# Patient Record
Sex: Male | Born: 1990 | Race: Black or African American | Hispanic: No | Marital: Single | Smoking: Former smoker
Health system: Southern US, Community
[De-identification: ages and names within clinical notes are randomized; demographics above are authoritative.]

## PROBLEM LIST (undated history)

## (undated) DIAGNOSIS — F419 Anxiety disorder, unspecified: Secondary | ICD-10-CM

---
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
## 2010-07-19 ENCOUNTER — Emergency Department (HOSPITAL_COMMUNITY)
Admission: EM | Admit: 2010-07-19 | Discharge: 2010-07-19 | Payer: Self-pay | Source: Home / Self Care | Admitting: Family Medicine

## 2010-07-20 LAB — POCT INFECTIOUS MONO SCREEN: Mono Screen: POSITIVE — AB

## 2010-07-20 LAB — POCT RAPID STREP A (OFFICE): Streptococcus, Group A Screen (Direct): NEGATIVE

## 2012-04-13 ENCOUNTER — Encounter (HOSPITAL_COMMUNITY): Payer: Self-pay

## 2012-04-13 ENCOUNTER — Encounter (HOSPITAL_COMMUNITY)
Admission: EM | Disposition: A | Discharge status: Home Health | Payer: Self-pay | Source: Home / Self Care | Attending: Otolaryngology

## 2012-04-13 ENCOUNTER — Inpatient Hospital Stay (HOSPITAL_COMMUNITY)
Admission: EM | Admit: 2012-04-13 | Discharge: 2012-04-19 | DRG: 013 | Disposition: A | Discharge status: Home Health | Payer: 59 | Attending: Otolaryngology | Admitting: Otolaryngology

## 2012-04-13 ENCOUNTER — Encounter (HOSPITAL_COMMUNITY): Payer: Self-pay | Admitting: Certified Registered Nurse Anesthetist

## 2012-04-13 ENCOUNTER — Emergency Department (HOSPITAL_COMMUNITY): Payer: 59

## 2012-04-13 ENCOUNTER — Observation Stay (HOSPITAL_COMMUNITY): Payer: 59 | Admitting: Certified Registered Nurse Anesthetist

## 2012-04-13 DIAGNOSIS — Y998 Other external cause status: Secondary | ICD-10-CM

## 2012-04-13 DIAGNOSIS — R0989 Other specified symptoms and signs involving the circulatory and respiratory systems: Secondary | ICD-10-CM | POA: Diagnosis not present

## 2012-04-13 DIAGNOSIS — Y9210 Unspecified residential institution as the place of occurrence of the external cause: Secondary | ICD-10-CM | POA: Diagnosis present

## 2012-04-13 DIAGNOSIS — S02620A Fracture of subcondylar process of mandible, unspecified side, initial encounter for closed fracture: Principal | ICD-10-CM | POA: Diagnosis present

## 2012-04-13 DIAGNOSIS — W19XXXA Unspecified fall, initial encounter: Secondary | ICD-10-CM | POA: Diagnosis present

## 2012-04-13 DIAGNOSIS — R55 Syncope and collapse: Secondary | ICD-10-CM

## 2012-04-13 DIAGNOSIS — R221 Localized swelling, mass and lump, neck: Secondary | ICD-10-CM | POA: Diagnosis not present

## 2012-04-13 DIAGNOSIS — F17200 Nicotine dependence, unspecified, uncomplicated: Secondary | ICD-10-CM | POA: Diagnosis present

## 2012-04-13 DIAGNOSIS — S02610A Fracture of condylar process of mandible, unspecified side, initial encounter for closed fracture: Secondary | ICD-10-CM

## 2012-04-13 DIAGNOSIS — R0609 Other forms of dyspnea: Secondary | ICD-10-CM | POA: Diagnosis not present

## 2012-04-13 DIAGNOSIS — S0266XA Fracture of symphysis of mandible, initial encounter for closed fracture: Secondary | ICD-10-CM | POA: Diagnosis present

## 2012-04-13 DIAGNOSIS — R220 Localized swelling, mass and lump, head: Secondary | ICD-10-CM | POA: Diagnosis not present

## 2012-04-13 DIAGNOSIS — S0180XA Unspecified open wound of other part of head, initial encounter: Secondary | ICD-10-CM | POA: Diagnosis present

## 2012-04-13 HISTORY — PX: LACERATION REPAIR: SHX5284

## 2012-04-13 HISTORY — PX: ORIF MANDIBULAR FRACTURE: SHX2127

## 2012-04-13 SURGERY — OPEN REDUCTION INTERNAL FIXATION (ORIF) MANDIBULAR FRACTURE
Anesthesia: General | Site: Mouth | Wound class: Clean Contaminated

## 2012-04-13 MED ORDER — DIPHENHYDRAMINE HCL 50 MG/ML IJ SOLN
12.5000 mg | Freq: Four times a day (QID) | INTRAMUSCULAR | Status: DC | PRN
  Administered 2012-04-14 – 2012-04-18 (×3): 12.5 mg via INTRAVENOUS
  Filled 2012-04-13 (×2): qty 1
  Filled 2012-04-13: qty 0.25
  Filled 2012-04-13: qty 1

## 2012-04-13 MED ORDER — ARTIFICIAL TEARS OP OINT
TOPICAL_OINTMENT | OPHTHALMIC | Status: DC | PRN
  Administered 2012-04-13: 1 via OPHTHALMIC

## 2012-04-13 MED ORDER — DEXTROSE-NACL 5-0.45 % IV SOLN
INTRAVENOUS | Status: DC
  Administered 2012-04-14: 02:00:00 via INTRAVENOUS
  Administered 2012-04-14: 75 mL/h via INTRAVENOUS
  Administered 2012-04-17: 06:00:00 via INTRAVENOUS
  Administered 2012-04-18: 75 mL/h via INTRAVENOUS
  Administered 2012-04-18: 1000 mL via INTRAVENOUS
  Administered 2012-04-19: 08:00:00 via INTRAVENOUS

## 2012-04-13 MED ORDER — MORPHINE SULFATE 4 MG/ML IJ SOLN
4.0000 mg | Freq: Once | INTRAMUSCULAR | Status: AC
  Administered 2012-04-13: 4 mg via INTRAVENOUS
  Filled 2012-04-13: qty 1

## 2012-04-13 MED ORDER — DEXAMETHASONE SODIUM PHOSPHATE 10 MG/ML IJ SOLN
10.0000 mg | Freq: Three times a day (TID) | INTRAMUSCULAR | Status: AC
  Administered 2012-04-14 – 2012-04-15 (×4): 10 mg via INTRAVENOUS
  Filled 2012-04-13 (×10): qty 1

## 2012-04-13 MED ORDER — ONDANSETRON HCL 4 MG/2ML IJ SOLN
4.0000 mg | Freq: Four times a day (QID) | INTRAMUSCULAR | Status: DC | PRN
  Administered 2012-04-17: 4 mg via INTRAVENOUS
  Filled 2012-04-13: qty 2

## 2012-04-13 MED ORDER — MORPHINE SULFATE 2 MG/ML IJ SOLN
2.0000 mg | INTRAMUSCULAR | Status: DC | PRN
  Administered 2012-04-14 (×3): 2 mg via INTRAVENOUS
  Filled 2012-04-13 (×5): qty 1

## 2012-04-13 MED ORDER — ACETAMINOPHEN 160 MG/5ML PO SOLN
325.0000 mg | ORAL | Status: DC | PRN
  Filled 2012-04-13: qty 10.2

## 2012-04-13 MED ORDER — OXYMETAZOLINE HCL 0.05 % NA SOLN
NASAL | Status: DC | PRN
  Administered 2012-04-13: 1 via NASAL

## 2012-04-13 MED ORDER — SUCCINYLCHOLINE CHLORIDE 20 MG/ML IJ SOLN
INTRAMUSCULAR | Status: DC | PRN
  Administered 2012-04-13: 100 mg via INTRAVENOUS
  Administered 2012-04-13: 20 mg via INTRAVENOUS

## 2012-04-13 MED ORDER — MIDAZOLAM HCL 5 MG/5ML IJ SOLN
INTRAMUSCULAR | Status: DC | PRN
  Administered 2012-04-13: 2 mg via INTRAVENOUS

## 2012-04-13 MED ORDER — LIDOCAINE HCL (CARDIAC) 20 MG/ML IV SOLN
INTRAVENOUS | Status: DC | PRN
  Administered 2012-04-13: 80 mg via INTRAVENOUS

## 2012-04-13 MED ORDER — PROPOFOL 10 MG/ML IV BOLUS
INTRAVENOUS | Status: DC | PRN
  Administered 2012-04-13: 60 mg via INTRAVENOUS
  Administered 2012-04-13: 200 mg via INTRAVENOUS

## 2012-04-13 MED ORDER — HYDROCODONE-ACETAMINOPHEN 7.5-500 MG/15ML PO SOLN
15.0000 mL | ORAL | Status: DC | PRN
  Administered 2012-04-14 – 2012-04-19 (×10): 15 mL via ORAL
  Filled 2012-04-13 (×10): qty 15

## 2012-04-13 MED ORDER — PROMETHAZINE HCL 25 MG/ML IJ SOLN
25.0000 mg | INTRAMUSCULAR | Status: DC | PRN
  Administered 2012-04-14 (×2): 12.5 mg via INTRAVENOUS
  Filled 2012-04-13: qty 1

## 2012-04-13 MED ORDER — LIDOCAINE-EPINEPHRINE 1 %-1:100000 IJ SOLN
INTRAMUSCULAR | Status: DC | PRN
  Administered 2012-04-13: 10 mL

## 2012-04-13 MED ORDER — FENTANYL CITRATE 0.05 MG/ML IJ SOLN
INTRAMUSCULAR | Status: DC | PRN
  Administered 2012-04-13 – 2012-04-14 (×5): 50 ug via INTRAVENOUS

## 2012-04-13 MED ORDER — ONDANSETRON HCL 4 MG PO TABS
4.0000 mg | ORAL_TABLET | Freq: Four times a day (QID) | ORAL | Status: DC | PRN
  Filled 2012-04-13: qty 0.5

## 2012-04-13 MED ORDER — LACTATED RINGERS IV SOLN
INTRAVENOUS | Status: DC | PRN
  Administered 2012-04-13 (×2): via INTRAVENOUS

## 2012-04-13 MED ORDER — ONDANSETRON HCL 4 MG/2ML IJ SOLN
INTRAMUSCULAR | Status: DC | PRN
  Administered 2012-04-13: 4 mg via INTRAVENOUS

## 2012-04-13 MED ORDER — CEFAZOLIN SODIUM 1-5 GM-% IV SOLN
1.0000 g | Freq: Three times a day (TID) | INTRAVENOUS | Status: DC
  Administered 2012-04-14 – 2012-04-17 (×10): 1 g via INTRAVENOUS
  Administered 2012-04-17: 2 g via INTRAVENOUS
  Administered 2012-04-17 – 2012-04-19 (×7): 1 g via INTRAVENOUS
  Filled 2012-04-13 (×25): qty 50

## 2012-04-13 MED ORDER — TETANUS-DIPHTH-ACELL PERTUSSIS 5-2.5-18.5 LF-MCG/0.5 IM SUSP
0.5000 mL | Freq: Once | INTRAMUSCULAR | Status: AC
  Administered 2012-04-13: 0.5 mL via INTRAMUSCULAR
  Filled 2012-04-13 (×2): qty 0.5

## 2012-04-13 MED ORDER — DEXAMETHASONE SODIUM PHOSPHATE 4 MG/ML IJ SOLN
INTRAMUSCULAR | Status: DC | PRN
  Administered 2012-04-13: 10 mg via INTRAVENOUS

## 2012-04-13 SURGICAL SUPPLY — 44 items
BIT DRILL TWIST 1.3X35MM (BIT) IMPLANT
BLADE SURG 15 STRL LF DISP TIS (BLADE) ×2 IMPLANT
BLADE SURG 15 STRL SS (BLADE) ×3
BLADE SURG ROTATE 9660 (MISCELLANEOUS) IMPLANT
CANISTER SUCTION 2500CC (MISCELLANEOUS) ×3 IMPLANT
CLEANER TIP ELECTROSURG 2X2 (MISCELLANEOUS) ×3 IMPLANT
CLOTH BEACON ORANGE TIMEOUT ST (SAFETY) ×3 IMPLANT
COVER SURGICAL LIGHT HANDLE (MISCELLANEOUS) ×3 IMPLANT
DECANTER SPIKE VIAL GLASS SM (MISCELLANEOUS) ×2 IMPLANT
DRILL TWIST 1.3X35MM (BIT) ×3
DRSG NASOPORE 8CM (GAUZE/BANDAGES/DRESSINGS) ×2 IMPLANT
ELECT COATED BLADE 2.86 ST (ELECTRODE) ×3 IMPLANT
ELECT REM PT RETURN 9FT ADLT (ELECTROSURGICAL) ×3
ELECTRODE REM PT RTRN 9FT ADLT (ELECTROSURGICAL) ×2 IMPLANT
GLOVE SURG SS PI 7.5 STRL IVOR (GLOVE) ×3 IMPLANT
GOWN STRL NON-REIN LRG LVL3 (GOWN DISPOSABLE) ×6 IMPLANT
KIT BASIN OR (CUSTOM PROCEDURE TRAY) ×3 IMPLANT
KIT ROOM TURNOVER OR (KITS) ×3 IMPLANT
NEEDLE 27GAX1X1/2 (NEEDLE) ×3 IMPLANT
NS IRRIG 1000ML POUR BTL (IV SOLUTION) ×3 IMPLANT
PAD ARMBOARD 7.5X6 YLW CONV (MISCELLANEOUS) ×6 IMPLANT
PENCIL BUTTON HOLSTER BLD 10FT (ELECTRODE) ×3 IMPLANT
PLATE 4H FRACTURE (Plate) ×2 IMPLANT
SCISSORS WIRE ANG 4 3/4 DISP (INSTRUMENTS) ×3 IMPLANT
SCREW BONE CROSS PIN 2.0X10MM (Screw) ×2 IMPLANT
SCREW BONE CROSS PIN 2.0X12MM (Screw) ×2 IMPLANT
SCREW UPPER FACE 2.0X12MM (Screw) ×4 IMPLANT
SCREW UPPER FACE 2.0X8MM (Screw) ×3 IMPLANT
SUT CHROMIC 4 0 PS 2 18 (SUTURE) ×2 IMPLANT
SUT ETHILON 4 0 CL P 3 (SUTURE) IMPLANT
SUT MON AB 3-0 SH 27 (SUTURE) ×6
SUT MON AB 3-0 SH27 (SUTURE) ×4 IMPLANT
SUT PROLENE 6 0 PC 1 (SUTURE) IMPLANT
SUT STEEL 0 (SUTURE)
SUT STEEL 0 18XMFL TIE 17 (SUTURE) IMPLANT
SUT STEEL 1 (SUTURE) IMPLANT
SUT STEEL 2 (SUTURE) IMPLANT
SUT STEEL 4 (SUTURE) IMPLANT
SUT VICRYL 4-0 PS2 18IN ABS (SUTURE) ×1 IMPLANT
TOWEL OR 17X24 6PK STRL BLUE (TOWEL DISPOSABLE) ×3 IMPLANT
TOWEL OR 17X26 10 PK STRL BLUE (TOWEL DISPOSABLE) ×3 IMPLANT
TRAY ENT MC OR (CUSTOM PROCEDURE TRAY) ×3 IMPLANT
TRAY FOLEY CATH 14FRSI W/METER (CATHETERS) IMPLANT
WATER STERILE IRR 1000ML POUR (IV SOLUTION) ×3 IMPLANT

## 2012-04-13 NOTE — ED Notes (Signed)
Pt had syncopal episode after giving blood, fell from standing position and hit face on curb, fell total of two times, abrasion on upper lip jaw bilateral hands.

## 2012-04-13 NOTE — Anesthesia Procedure Notes (Signed)
Procedure Name: Intubation Date/Time: 04/13/2012 11:00 PM Performed by: Julianne Rice Z Pre-anesthesia Checklist: Patient identified, Timeout performed, Emergency Drugs available, Suction available and Patient being monitored Patient Re-evaluated:Patient Re-evaluated prior to inductionOxygen Delivery Method: Circle system utilized Preoxygenation: Pre-oxygenation with 100% oxygen Intubation Type: IV induction Ventilation: Mask ventilation without difficulty Laryngoscope Size: Mac and 4 Grade View: Grade I Tube type: Oral Rae Nasal Tubes: Nasal prep performed and Magill forceps- large, utilized Tube size: 7.5 mm Number of attempts: 2 Airway Equipment and Method: Video-laryngoscopy Placement Confirmation: ETT inserted through vocal cords under direct vision,  positive ETCO2 and breath sounds checked- equal and bilateral Tube secured with: Tape Dental Injury: Teeth and Oropharynx as per pre-operative assessment and Bloody posterior oropharynx  Comments: Grade 1 view with laryngoscopy with MAC 4 but unable to pass through vocal cords; post. oropharynx bloody; changed to Glide Scope; Grade I view; easily intubated.

## 2012-04-13 NOTE — H&P (Addendum)
04/13/12 9:46 PM  Gabriel Nguyen  PREOPERATIVE HISTORY AND PHYSICAL  CHIEF COMPLAINT: comminuted mandible fracture  HISTORY: This is a 21 year old who presents with bilateral subcondylar and right parasymphyseal mandible fractures after falling after giving blood and striking his jaw/face on the ground.  He now presents for maxillomandibular fixation and open reduction and internal fixation of comminuted mandible fracture.  Dr. Emeline Darling, [name] has discussed the risks (including malocclusion, nonunion, malunion, bleeding, scarring, risks of general anesthesia), benefits, and alternatives of this procedure. The patient understands the risks and would like to proceed with the procedure. The chances of success of the procedure are >50%  and the patient understands this. I personally performed an examination of the patient within 24 hours of the procedure.  PAST MEDICAL HISTORY: History reviewed. No pertinent past medical history.   PAST SURGICAL HISTORY: History reviewed. No pertinent past surgical history.  MEDICATIONS: No current facility-administered medications on file prior to encounter.   No current outpatient prescriptions on file prior to encounter.    ALLERGIES: No Known Allergies  SOCIAL HISTORY: History   Social History  . Marital Status: Single    Spouse Name: N/A    Number of Children: N/A  . Years of Education: N/A   Occupational History  . Not on file.   Social History Main Topics  . Smoking status: Current Every Day Smoker -- 0.5 packs/day    Types: Cigarettes  . Smokeless tobacco: Not on file  . Alcohol Use: Yes     once a month  . Drug Use:   . Sexually Active:    Other Topics Concern  . Not on file   Social History Narrative  . No narrative on file    FAMILY HISTORY:History reviewed. No pertinent family history.  REVIEW OF SYSTEMS:  HEENT: facial pain and swelling/malocclusion   PHYSICAL EXAM:  GENERAL:  Awake, alert VITAL SIGNS:   Filed  Vitals:   04/13/12 1913  BP: 124/80  Pulse: 67  Temp: 97.7 F (36.5 C)  Resp: 18   SKIN:  Warm, dry HEENT:  Obvious class II malocclusion (underbite) with open bite deformity with 5 cm chin laceration and right subcondylar fracture and bilateral subcondylar deformity and stepoffs. Tongue is mobile. NECK:  Trachea midline, no edema LYMPH:  No LAD LUNGS:  Grossly clear CARDIOVASCULAR:  RRR ABDOMEN:  Soft, NT MUSCULOSKELETAL: normal LE, UE other than abrasions on hands PSYCH:  Normal affect NEUROLOGIC:  CN2-12 grossly intact and symmetric  DIAGNOSTIC STUDIES: CT maxillofacial shows bilateral displaced subcondylar fractures with right parasymphyseal fracture medial to right mental nerve.  ASSESSMENT AND PLAN: Plan to proceed with open reduction and internal fixation of right subcondylar fracture, closed reduction of bilateral subcondylar fractures with 8 -post MMF for 4-6 weeks. Patient understands the risks, benefits, and alternatives. Informed written consent on chart. 04/13/12 9:46 PM Gabriel Nguyen

## 2012-04-13 NOTE — ED Provider Notes (Signed)
History     CSN: [id number]  Arrival date & time 04/13/12  1905   First MD Initiated Contact with Patient 04/13/12 1931      Chief Complaint  Patient presents with  . Loss of Consciousness    Patient is a 21 y.o. male presenting with syncope. The history is provided by the patient.  Loss of Consciousness This is a new problem. Episode onset: just prior to arrival. The problem has been gradually improving. Associated symptoms include headaches. Pertinent negatives include no chest pain and no shortness of breath. Nothing aggravates the symptoms. Nothing relieves the symptoms.  Pt presents after syncopal episode He gave blood at blood drive today He drove himself to meet family, and about an hour after giving blood he had syncopal event.  Family attempted to help him and he had another brief syncopal event.  No seizure reported He now reports headache.  He reports he injured his face in the fall.  No neck or back pain No cp/sob.  No focal weakness  PMH - none  History reviewed. No pertinent past surgical history.  History reviewed. No pertinent family history.  History  Substance Use Topics  . Smoking status: Current Every Day Smoker -- 0.5 packs/day    Types: Cigarettes  . Smokeless tobacco: Not on file  . Alcohol Use: Yes     once a month      Review of Systems  Respiratory: Negative for shortness of breath.   Cardiovascular: Positive for syncope. Negative for chest pain.  Neurological: Positive for headaches.  All other systems reviewed and are negative.    Allergies  Review of patient's allergies indicates no known allergies.  Home Medications  No current outpatient prescriptions on file.  BP 124/80  Pulse 67  Temp 97.7 F (36.5 C) (Oral)  Resp 18  Ht 6\' 5"  (1.956 m)  Wt 170 lb (77.111 kg)  BMI 20.16 kg/m2  Physical Exam CONSTITUTIONAL: Well developed/well nourished HEAD AND FACE: Normocephalic/atraumatic EYES: EOMI/PERRL ENMT: no nasal  deformity/septal hematoma.  No dental fracture and midface stable.  He has malocclusion and tenderness to right lower mandible.  Laceration noted to lower chin.   NECK: no anterior neck tenderness.  No bruising noted.   SPINE:entire spine nontender, c-collar in place CV: S1/S2 noted, no murmurs/rubs/gallops noted LUNGS: Lungs are clear to auscultation bilaterally, no apparent distress ABDOMEN: soft, nontender, no rebound or guarding GU:no cva tenderness NEURO: Pt is awake/alert, moves all extremitiesx4, GCS 15 EXTREMITIES: pulses normal, full ROM.  Scattered abrasions to hands but no lacerations and no extremity injury noted SKIN: warm, color normal PSYCH: no abnormalities of mood noted  ED Course  Procedures 8:27 PM Pt here for syncopal event after donating blood.  He has no h/o syncope previously, no h/o seizure.  He is back to baseline.  He does have facial injury noted.  Unable to clear cspine due to severe pain in face. Ct imaging ordered Suspect EKG shows early repolarization No cp/sob reported    9:47 PM D/w dr Emeline Darling with ENT Will see in ED ccollar removed Pt denies cp/sob No extremity injury noted   MDM  Nursing notes including past medical history and social history reviewed and considered in documentation        Date: 04/13/2012  Rate: 71  Rhythm: normal sinus rhythm  QRS Axis: normal  Intervals: normal  ST/T Wave abnormalities: early repolarization  Conduction Disutrbances:none  Narrative Interpretation:   Old EKG Reviewed: none available  Joya Gaskins, MD 04/13/12 [phone]

## 2012-04-13 NOTE — Anesthesia Preprocedure Evaluation (Addendum)
Anesthesia Evaluation  Patient identified by MRN, date of birth, ID band Patient awake    Reviewed: Allergy & Precautions, H&P , NPO status , Patient's Chart, lab work & pertinent test results  Airway Mallampati: II      Dental   Pulmonary neg pulmonary ROS, Current Smoker,  breath sounds clear to auscultation        Cardiovascular Rhythm:Regular Rate:Normal     Neuro/Psych    GI/Hepatic negative GI ROS, Neg liver ROS,   Endo/Other  negative endocrine ROS  Renal/GU      Musculoskeletal   Abdominal   Peds  Hematology negative hematology ROS (+)   Anesthesia Other Findings   Reproductive/Obstetrics                         Anesthesia Physical Anesthesia Plan  ASA: II  Anesthesia Plan: General   Post-op Pain Management:    Induction: Intravenous, Rapid sequence and Cricoid pressure planned  Airway Management Planned: Nasal ETT  Additional Equipment:   Intra-op Plan:   Post-operative Plan: Possible Post-op intubation/ventilation  Informed Consent:   Dental advisory given  Plan Discussed with: CRNA, Anesthesiologist and Surgeon  Anesthesia Plan Comments:        Anesthesia Quick Evaluation

## 2012-04-13 NOTE — Preoperative (Signed)
Beta Blockers   Reason not to administer Beta Blockers:Not Applicable 

## 2012-04-14 ENCOUNTER — Encounter (HOSPITAL_COMMUNITY): Payer: Self-pay | Admitting: Anesthesiology

## 2012-04-14 ENCOUNTER — Observation Stay (HOSPITAL_COMMUNITY): Payer: 59 | Admitting: Anesthesiology

## 2012-04-14 ENCOUNTER — Encounter (HOSPITAL_COMMUNITY)
Admission: EM | Disposition: A | Discharge status: Home Health | Payer: Self-pay | Source: Home / Self Care | Attending: Otolaryngology

## 2012-04-14 ENCOUNTER — Observation Stay (HOSPITAL_COMMUNITY): Payer: 59

## 2012-04-14 HISTORY — PX: TRACHEOSTOMY TUBE PLACEMENT: SHX814

## 2012-04-14 LAB — POCT I-STAT 4, (NA,K, GLUC, HGB,HCT)
Glucose, Bld: 266 mg/dL — ABNORMAL HIGH (ref 70–99)
HCT: 43 % (ref 39.0–52.0)
Hemoglobin: 14.6 g/dL (ref 13.0–17.0)
Potassium: 4.1 meq/L (ref 3.5–5.1)
Sodium: 137 meq/L (ref 135–145)

## 2012-04-14 LAB — MRSA PCR SCREENING: MRSA by PCR: NEGATIVE

## 2012-04-14 SURGERY — CREATION, TRACHEOSTOMY
Anesthesia: General | Site: Neck | Wound class: Clean

## 2012-04-14 MED ORDER — HYDROMORPHONE HCL PF 1 MG/ML IJ SOLN
0.2500 mg | INTRAMUSCULAR | Status: DC | PRN
  Administered 2012-04-14: 0.5 mg via INTRAVENOUS

## 2012-04-14 MED ORDER — BACITRACIN ZINC 500 UNIT/GM EX OINT
TOPICAL_OINTMENT | Freq: Three times a day (TID) | CUTANEOUS | Status: DC
  Administered 2012-04-14: 1 via TOPICAL
  Administered 2012-04-15: 16:00:00 via TOPICAL
  Administered 2012-04-15: 1 via TOPICAL
  Administered 2012-04-16 – 2012-04-19 (×9): via TOPICAL
  Filled 2012-04-14 (×3): qty 15

## 2012-04-14 MED ORDER — ALPRAZOLAM 0.5 MG PO TABS
1.0000 mg | ORAL_TABLET | Freq: Two times a day (BID) | ORAL | Status: DC | PRN
  Administered 2012-04-14 – 2012-04-15 (×2): 1 mg via ORAL
  Filled 2012-04-14 (×2): qty 2

## 2012-04-14 MED ORDER — DEXTROSE-NACL 5-0.45 % IV SOLN
INTRAVENOUS | Status: DC | PRN
  Administered 2012-04-14: 03:00:00 via INTRAVENOUS

## 2012-04-14 MED ORDER — HYDROMORPHONE HCL PF 1 MG/ML IJ SOLN: 0.2500 mg | INTRAMUSCULAR | Status: DC | PRN

## 2012-04-14 MED ORDER — LIDOCAINE-EPINEPHRINE 1 %-1:100000 IJ SOLN
INTRAMUSCULAR | Status: DC | PRN
  Administered 2012-04-14: 7 mL

## 2012-04-14 MED ORDER — MORPHINE SULFATE 2 MG/ML IJ SOLN
2.0000 mg | INTRAMUSCULAR | Status: DC | PRN
  Administered 2012-04-14 – 2012-04-18 (×16): 2 mg via INTRAVENOUS
  Filled 2012-04-14 (×5): qty 1
  Filled 2012-04-14: qty 2
  Filled 2012-04-14 (×8): qty 1

## 2012-04-14 MED ORDER — PROPOFOL 10 MG/ML IV BOLUS
INTRAVENOUS | Status: DC | PRN
  Administered 2012-04-14: 50 mg via INTRAVENOUS

## 2012-04-14 SURGICAL SUPPLY — 47 items
BLADE SURG 11 STRL SS (BLADE) ×1 IMPLANT
BLADE SURG 15 STRL LF DISP TIS (BLADE) ×1 IMPLANT
BLADE SURG 15 STRL SS (BLADE) ×2
BLADE SURG ROTATE 9660 (MISCELLANEOUS) IMPLANT
CANISTER SUCTION 2500CC (MISCELLANEOUS) ×2 IMPLANT
CLEANER TIP ELECTROSURG 2X2 (MISCELLANEOUS) ×2 IMPLANT
CLOTH BEACON ORANGE TIMEOUT ST (SAFETY) ×2 IMPLANT
COVER SURGICAL LIGHT HANDLE (MISCELLANEOUS) ×2 IMPLANT
DECANTER SPIKE VIAL GLASS SM (MISCELLANEOUS) ×2 IMPLANT
ELECT COATED BLADE 2.86 ST (ELECTRODE) ×3 IMPLANT
ELECT REM PT RETURN 9FT ADLT (ELECTROSURGICAL) ×2
ELECTRODE REM PT RTRN 9FT ADLT (ELECTROSURGICAL) ×1 IMPLANT
GAUZE SPONGE 4X4 16PLY XRAY LF (GAUZE/BANDAGES/DRESSINGS) ×3 IMPLANT
GAUZE XEROFORM 5X9 LF (GAUZE/BANDAGES/DRESSINGS) IMPLANT
GLOVE BIOGEL PI IND STRL 7.0 (GLOVE) IMPLANT
GLOVE BIOGEL PI INDICATOR 7.0 (GLOVE) ×1
GLOVE SS N UNI LF 7.0 STRL (GLOVE) ×1 IMPLANT
GLOVE SURG SS PI 7.5 STRL IVOR (GLOVE) ×4 IMPLANT
GOWN STRL NON-REIN LRG LVL3 (GOWN DISPOSABLE) ×6 IMPLANT
HOLDER TRACH TUBE TIE MED (TUBING) ×1 IMPLANT
HOLDER TRACH TUBE VELCRO 19.5 (MISCELLANEOUS) IMPLANT
KIT BASIN OR (CUSTOM PROCEDURE TRAY) ×2 IMPLANT
KIT ROOM TURNOVER OR (KITS) ×2 IMPLANT
KIT SUCTION CATH 14FR (SUCTIONS) ×1 IMPLANT
MARKER SKIN DUAL TIP RULER LAB (MISCELLANEOUS) ×1 IMPLANT
NDL HYPO 25GX1X1/2 BEV (NEEDLE) ×1 IMPLANT
NEEDLE HYPO 25GX1X1/2 BEV (NEEDLE) ×4 IMPLANT
NS IRRIG 1000ML POUR BTL (IV SOLUTION) ×2 IMPLANT
PACK EENT II TURBAN DRAPE (CUSTOM PROCEDURE TRAY) ×2 IMPLANT
PAD ARMBOARD 7.5X6 YLW CONV (MISCELLANEOUS) ×4 IMPLANT
PENCIL BUTTON HOLSTER BLD 10FT (ELECTRODE) ×2 IMPLANT
SPONGE INTESTINAL PEANUT (DISPOSABLE) IMPLANT
SUT CHROMIC 2 0 SH (SUTURE) ×2 IMPLANT
SUT ETHILON 2 0 FS 18 (SUTURE) ×4 IMPLANT
SUT SILK 2 0 FS (SUTURE) ×2 IMPLANT
SUT SILK 3 0 REEL (SUTURE) ×2 IMPLANT
SUT VIC AB 2-0 CT1 27 (SUTURE) ×2
SUT VIC AB 2-0 CT1 TAPERPNT 27 (SUTURE) IMPLANT
SYR 20CC LL (SYRINGE) ×2 IMPLANT
SYR BULB IRRIGATION 50ML (SYRINGE) IMPLANT
SYR CONTROL 10ML LL (SYRINGE) ×3 IMPLANT
SYR TOOMEY 50ML (SYRINGE) ×1 IMPLANT
TOWEL OR 17X24 6PK STRL BLUE (TOWEL DISPOSABLE) ×3 IMPLANT
TOWEL OR 17X26 10 PK STRL BLUE (TOWEL DISPOSABLE) ×2 IMPLANT
TUBE CONNECTING 12X1/4 (SUCTIONS) ×2 IMPLANT
TUBE TRACH SHILEY  6 DIST  CUF (TUBING) ×1 IMPLANT
WATER STERILE IRR 1000ML POUR (IV SOLUTION) ×2 IMPLANT

## 2012-04-14 NOTE — Progress Notes (Signed)
NG tube advanced by Dr. Emeline Darling.  No orders to put to suction found.  Dr. Emeline Darling put tube briefly to suction here in pacu, with no return of stomach contents.

## 2012-04-14 NOTE — Anesthesia Postprocedure Evaluation (Signed)
  Anesthesia Post-op Note  Patient: Gabriel Nguyen  Procedure(s) Performed: Procedure(s) (LRB) with comments: TRACHEOSTOMY (N/A)  Patient Location: PACU  Anesthesia Type: MAC  Level of Consciousness: awake  Airway and Oxygen Therapy: Patient Spontanous Breathing  Post-op Pain: mild  Post-op Assessment: Post-op Vital signs reviewed  Post-op Vital Signs: Reviewed  Complications: Patient with floor of the mouth swelling  requiring emergency trach. 02 sat 99-100%. Stable in PACU.

## 2012-04-14 NOTE — Progress Notes (Signed)
Clinical Social Work Department BRIEF PSYCHOSOCIAL ASSESSMENT 04/14/2012  Patient:  Gabriel Nguyen, Gabriel Nguyen     Account Number:  [id number]     Admit date:  04/13/2012  Clinical Social Worker:  Madaline Guthrie  Date/Time:  04/14/2012 04:00 PM  Referred by:  RN  Date Referred:  04/14/2012 Referred for  Psychosocial assessment   Other Referral:   Interview type:  Other - See comment Other interview type:    PSYCHOSOCIAL DATA Living Status:  FAMILY Admitted from facility:   Level of care:   Primary support name:   Primary support relationship to patient:   Degree of support available:    CURRENT CONCERNS Current Concerns  None Noted   Other Concerns:    SOCIAL WORK ASSESSMENT / PLAN CSW consult received for support.  CSW went to pt's room and he was alone and sleeping comfortably.  CSW talked with RN who stated pt has had alot of family visit and has alot of support.  CSW will follow and assist as needed.   Assessment/plan status:  Psychosocial Support/Ongoing Assessment of Needs Other assessment/ plan:   Information/referral to community resources:    PATIENT'S/FAMILY'S RESPONSE TO PLAN OF CARE: Pt was sleeping.  Family was not present.

## 2012-04-14 NOTE — Progress Notes (Signed)
SLP Cancellation Note  Patient Details Name: Gabriel Nguyen MRN: [id number] DOB: July 17, 1990   Cancelled Evaluation:       Reason Eval/Treat Not Completed: Medical issues which prohibited therapy. Reviewed chart and spoke with RN. At this time, patient with excessive tracheal and oral secretions, using yankauers to suction oral cavity. Do not feel patient is ready for pos at this time given high aspiration risk, RN in agreement. Ideally, recommend PMSV evaluation prior to swallow evaluation to assess for patency of upper airway for more comprehensive evaluation of swallow. Plan to f/u in am 10/12 to determine readiness for evaluations.   Ferdinand Lango MA, CCC-SLP [phone]   [name] 04/14/2012, 9:23 AM

## 2012-04-14 NOTE — OR Nursing (Signed)
Patient had an ORIF of mandible fracture and was brought back to the OR due to airway obstruction.

## 2012-04-14 NOTE — Anesthesia Postprocedure Evaluation (Signed)
  Anesthesia Post-op Note  Patient: Gabriel Nguyen  Procedure(s) Performed: Procedure(s) (LRB) with comments: OPEN REDUCTION INTERNAL FIXATION (ORIF) MANDIBULAR FRACTURE (N/A) REPAIR MULTIPLE LACERATIONS () - Repair of chin laceration  Patient Location: PACU  Anesthesia Type: General  Level of Consciousness: awake  Airway and Oxygen Therapy: Patient Spontanous Breathing  Post-op Pain: mild  Post-op Assessment: Post-op Vital signs reviewed  Post-op Vital Signs: Reviewed  Complications: No apparent anesthesia complications

## 2012-04-14 NOTE — Op Note (Signed)
04/14/2012 4:57 AM  Gabriel Nguyen [id number]  Pre-Op Dx: mandible fracture s/p ORIF/MMF with tongue edema and airway distress  Post-Op Dx:  mandible fracture s/p ORIF/MMF with tongue edema and airway distress  Proc:  31603-emergency transtracheal Tracheostomy  Surg:  Melvenia Beam  Anes:  Local converted to general via tracheotomy  EBL:  minimal  Comp:  None  Drains: 6 cuffless shiley tracheotomy  Findings:  Sublingual bilateral floor of mouth edema/hematoma with retropulsion of the tongue and inability to handle secretions and airway distress. Class I MMF with dental elastics post-op, normal tracheal anatomy.  Procedure:  The patient was brought from the 5N floor emergently to the operating room and transferred to an operating table.   The patient was placed in a slight reverse Trendelenburg.  Neck extension was achieved as possible.  The lower neck was palpated and a horizontal incision was marked out over the trachea.  Local Anesthesia was administered  With 1% Xylocaine with 1:100,000 epinephrine, 10 cc's,  infiltrated into the anterior neck.  A betadine sterile preparation of the lower neck and upper chest was performed in the standard fashion.  Sterile draping was accomplished in the standard fashion.  A  3 cm transverse incision was made sharply approximately halfway between the sternal notch and cricoid cartilage and extended through skin and subcutaneous fat.  Using cautery, the superficial layer of the deep cervical fascia was lysed.  Additional dissection revealed the strap muscles.  The midline raphe was divided in two layers and the muscles retracted laterally.  The pretracheal plane was visualized.  This was entered bluntly.  The thyroid isthmus was divided with the Bovie and the thyroid gland was retracted to either side.  The anterior face of the trachea was cleared.  In the  2-3 interspace, a transverse incision was made between cartilage rings into the tracheal lumen.  A 6  mm wide inferiorly based flap was generated and secured to the lower wound with a 2-0 vicryl suture.  Mucosal edges were cauterized for hemostasis.  A previously tested  # 6 Shiley cuffed tracheostomy tube was brought into the field.  The tracheostomy tube was inserted into the tracheal lumen.  Hemostasis was observed. The cuff was inflated and observed to be intact and containing pressure. The inner cannula was placed and ventilation assumed per tracheostomy tube.  Good chest wall motion was observed, and CO2 was documented per anesthesia.  The trach tube was secured in the standard fashion with velcro ties and 4 quadrant silk sutures.  Hemostasis was observed again.    Next the oral cavity was inspected. The wires had been cut and removed emergently on the floor when the patient vomited and was unable to handle his secretions. A lip retractor was placed and the patient's mandible was re-manipulated back into class I occlusion. He was then held in class I occlusion with multiple dental elastics to re-secure his 8 post MMF. Prior to replacing the MMF with the dental elastics the tongue was inspected and noted to have significant sublingual edema and sublingual hematoma with retropulsion of the tongue. A right nasogastric tube was placed and after auscultation confirmed correct placement the tube was secured to the patient's nose with tape.  The patient tolerated the procedure well with no immediate complications. The patient was returned to anesthesia, awakened as possible, and transferred to the PACU in stable condition.  Dr. Melvenia Beam was present and performed the entire procedure.  Melvenia Beam 5:08 AM 04/14/12

## 2012-04-14 NOTE — Progress Notes (Signed)
Subjective: Called emergently to 5N floor with report of patient less than 2 hours s/p MMF/ORIF mandible fractures with emesis and difficulty handling secretions, nursing cut patient's wires with wires cutters.  Objective: Vital signs in last 24 hours: Temp:  [97.7 F (36.5 C)-98.2 F (36.8 C)] 98.2 F (36.8 C) (10/11 0052) Pulse Rate:  [67-110] 110  (10/11 0330) Resp:  [14-27] 27  (10/11 0448) BP: (124-155)/(80-92) 148/92 mmHg (10/11 0130) SpO2:  [92 %-100 %] 100 % (10/11 0448) Weight:  [77.111 kg (170 lb)] 77.111 kg (170 lb) (10/10 1913)  When seen patient is uncomfortable, unable to lie flat and sitting upright in bed. Tongue is edematous and retropulsed with sublingual hematoma/edema. His wires had been cut and these were removed using the bulldog forceps. His pre-op open bite deformity and class II malocclusion was noted. He complained of increasing inability to swallow his secretions and difficulty breathing.   @LABLAST2 (wbc:2,hgb:2,hct:2,plt:2) No results found for this basename: NA:2,K:2,CL:2,CO2:2,GLUCOSE:2,BUN:2,CREATININE:2,CALCIUM:2 in the last 72 hours  Medications:  No current facility-administered medications on file prior to encounter.   No current outpatient prescriptions on file prior to encounter.    Assessment/Plan: Given the patient's worsening airway distress and inability to handle his secretions, I counseled the patient and his mother that emergency awake tracheotomy is needed to prevent death from airway compromise. I discussed the risks including death, airway loss, bleeding, infection. Informed written consent was obtained from patient's mother and is signed, witnessed and on chart. See awake tracheotomy operative note from same day. His MMF will be replaced with dental elastics in the OR as well. He will need to keep tracheotomy in place until mandible fractures have healed, will be decannulated when hardware is removed. We will transfer to ICU post-op,  speech/trach team consult. Family agrees with plan.   LOS: 1 day   Melvenia Beam 04/14/2012, 5:09 AM

## 2012-04-14 NOTE — Op Note (Signed)
04/14/2012  12:48 AM    Gabriel Nguyen  [id number]   Pre-Op Dx:  Bilateral subcondylar and right parasymphyseal mandible fractures, 5 cm right chin laceration  Post-op Dx: Bilateral subcondylar and right parasymphyseal mandible fractures, 5 cm right chin laceration  Proc: 21462 Open treatment of mandibular fracture; with interdental fixation  13132-complex repair of 5 cm right chin laceration  Surg:  Melvenia Beam  Anes:  GNT  EBL:  <64mL  Comp:  none  Findings: preoperative open bite and class 2 malocclusion reduced to patient's pre-morbid class I maxillomandibular occlusion with good reduction of the bilateral subcondylar fractures with MMF and good reduction of the right parasymphyseal fracture with a 4-hole 2 mm titanium plate.  Procedure: With the patient in a comfortable supine position, general nasotracheal anesthesia was induced without difficulty.   The face and oral cavity were prepped with betadine and draped in the usual sterile fashion and the eyes were protected appropriately.  1% Xylocaine with 1:100,000 epinephrine was infiltrated to the maxillary and mandibular gingival mucosa on each side in anticipation of bicortical screw placement.  Several minutes were allowed for this to take effect.  The jaws were manipulated to reestablish the patient's native class I occlusion.  Some damage to the occlusal surfaces of the 2nd and 3rd molars was noted from the fall, and some small tooth fragments were debrided as needed.  Four  8 mm bicortical screws were placed superolateral to the  Maxillary canine roots (2 on each side), and four 12mm screws were placed inferolateral to the mandibular canine roots (2 on each side).  Hemostasis was observed.  24-gauge stainless steel wire loops were prepared.  The pharynx was suctioned free.  The jaws were manipulated back into class I occlusion.  Four vertical MMF wire loops were applied and gently tightened down.  Good class I occlusion  was noted.  Tightening was completed.  The wire ends were cut and then twisted with the ends buried to avoid trauma to the oral vestibule.  Hemostasis was observed and the tongue was free of the teeth.  Next open reduction and internal fixation of the patient's right parasymphysealmandibular fracture was performed by using the Bovie to incise the gingiva in a horizontal, genioplasty-type midline incision. The mandibular outer cortex was exposed and the periosteum was elevated using the periosteal elevator. The main right mental nerve trunk and the arborizing branches of the nerve were identified and preserved on the right. The fracture at the right parasymphysis was exposed. The mandibular branch of the trigeminal nerve was identified and preserved. The fracture was well-reduced and nondisplaced. The fracture was then plated with good reduction using a 4 hole 2mm plate and two 12mm screws and 2 10mm screws. Once good reduction was noted the incision was irrigated out and sutured using a running locking 4-0 chromic gut mucosal suture.  Next the right 5cm deep chin laceration was closed by closing the muscular layer with deep, buried 4-0 vicryl and the skin with interrupted simple 5-0 chromic gut sutures. The edges were debrided as necessary using the short sharp scissors.  The patient was returned to the care of anesthesia, fully awakened and extubated.  The patient was transferred to the postoperative recovery area in stable condition.The patient tolerated the procedure well with no complications.  Dr. Melvenia Beam was present and performed the entire procedure.  Plan: Ice, elevation,  Narcotic analgesia, antiemetics.  We will send the patient home with wire cutters.   Liquid/wired jaw diet.  Recheck in the office as scheduled in one week. MMF for 4-6 weeks.    Melvenia Beam 12:48 AM 04/14/12

## 2012-04-14 NOTE — Anesthesia Preprocedure Evaluation (Signed)
Anesthesia Evaluation  Patient identified by MRN, date of birth, ID band Patient awake    Reviewed: Allergy & Precautions, H&P , NPO status , reviewed documented beta blocker date and time   Airway Mallampati: IV    Mouth opening: Limited Mouth Opening  Dental   Pulmonary  Patient awake and spontaneously breathing. O2 sat 99-100% upon arrival to OR room. breath sounds clear to auscultation        Cardiovascular negative cardio ROS  Rhythm:Regular Rate:Normal     Neuro/Psych    GI/Hepatic negative GI ROS, Neg liver ROS,   Endo/Other  negative endocrine ROS  Renal/GU negative Renal ROS     Musculoskeletal   Abdominal   Peds  Hematology   Anesthesia Other Findings   Reproductive/Obstetrics                           Anesthesia Physical Anesthesia Plan  ASA: II and Emergent  Anesthesia Plan: MAC   Post-op Pain Management:    Induction: Intravenous  Airway Management Planned: Tracheostomy  Additional Equipment:   Intra-op Plan:   Post-operative Plan: Possible Post-op intubation/ventilation  Informed Consent: I have reviewed the patients History and Physical, chart, labs and discussed the procedure including the risks, benefits and alternatives for the proposed anesthesia with the patient or authorized representative who has indicated his/her understanding and acceptance.     Plan Discussed with: CRNA, Surgeon and Anesthesiologist  Anesthesia Plan Comments:         Anesthesia Quick Evaluation

## 2012-04-14 NOTE — Progress Notes (Signed)
04/14/2012 8:20 PM  Gabriel Nguyen [id number]  Post-Op check    Temp:  [97.9 F (36.6 C)-99.4 F (37.4 C)] 98.1 F (36.7 C) (10/11 2000) Pulse Rate:  [68-110] 68  (10/11 2000) Resp:  [12-38] 16  (10/11 2000) BP: (128-169)/(82-103) 139/83 mmHg (10/11 2000) SpO2:  [92 %-100 %] 100 % (10/11 2000) FiO2 (%):  [3 %-35 %] 35 % (10/11 2000),     Intake/Output Summary (Last 24 hours) at 04/14/12 2020 Last data filed at 04/14/12 1900  Gross per 24 hour  Intake 3532.5 ml  Output   1400 ml  Net 2132.5 ml    Results for orders placed during the hospital encounter of 04/13/12 (from the past 24 hour(s))  POCT I-STAT 4, (NA,K, GLUC, HGB,HCT)     Status: Abnormal   Collection Time   04/14/12  4:28 AM      Component Value Range   Sodium 137  135 - 145 mEq/L   Potassium 4.1  3.5 - 5.1 mEq/L   Glucose, Bld 266 (*) 70 - 99 mg/dL   HCT 44.0  34.7 - 42.5 %   Hemoglobin 14.6  13.0 - 17.0 g/dL  MRSA PCR SCREENING     Status: Normal   Collection Time   04/14/12  6:25 AM      Component Value Range   MRSA by PCR NEGATIVE  NEGATIVE    SUBJECTIVE:  Breathing well.  Pain controlled.  Less anxiety.  Voiding spont.  No NG feeds yet  OBJECTIVE:  Awake, alert.  Breathing well through trach.  Trach clean and secure with cuff inflated.  Occlusion secure and intact.  IMPRESSION:  Satisfactory check  PLAN:  Ice, elevation.  Will begin activity and NG feeds in AM.  Trach cuff down at that time if he is doing well.  Panorex to assess repair. Home Health consult for trach care.  [name]

## 2012-04-14 NOTE — Progress Notes (Signed)
Subjective: POD#0 from emegent tracheotomy, POD#1 from ORIF bilateral subcondylar and right parasymphyseal fractures. Doing well, breathing comfortably through trach. Still suctioning oral cavity frequently so speech elected to delay swallow eval.   Objective: Vital signs in last 24 hours: Temp:  [97.7 F (36.5 C)-99.4 F (37.4 C)] 98.4 F (36.9 C) (10/11 1159) Pulse Rate:  [67-110] 73  (10/11 1500) Resp:  [12-38] 17  (10/11 1500) BP: (124-169)/(80-103) 147/93 mmHg (10/11 1500) SpO2:  [92 %-100 %] 100 % (10/11 1500) FiO2 (%):  [3 %-35 %] 35 % (10/11 1122) Weight:  [77.111 kg (170 lb)] 77.111 kg (170 lb) (10/10 1913)  MMF intact with 8 post MMF screws and dental elastics. Class I occlusion. Intraoral incision C/D/I. Neck supple, trachea midline with 6 cuffed shiley trach in place with cuff up, good sat's on humidified trach collar. Awake, alert, writing messages. Cn 2-12 intact and symmetric. NGtube secure in right nasopharynx.  @LABLAST2 (wbc:2,hgb:2,hct:2,plt:2)  Basename 04/14/12 0428  NA 137  K 4.1  CL --  CO2 --  GLUCOSE 266*  BUN --  CREATININE --  CALCIUM --    Medications:  No current facility-administered medications on file prior to encounter.   No current outpatient prescriptions on file prior to encounter.    Assessment/Plan: S/p emergent trach for tongue edema/hematoma after ORIF bilateral subcondylar fractures and right parasymphyseal fracture of mandible. Doing better s/p trach. WIll change trach early next week, trach care and suctioning, speech reeval when less swollen and handling secretions better to possibly start diet, if NPO for prolonged period can start tube feeds for nutrition. I explained to patient that we should likely leave trach in place until his hardware is removed in 4-6 weeks given this acute life-threatening oral cavity/airway edema.   LOS: 1 day   Melvenia Beam 04/14/2012, 3:48 PM

## 2012-04-14 NOTE — Transfer of Care (Signed)
Immediate Anesthesia Transfer of Care Note  Patient: Gabriel Nguyen  Procedure(s) Performed: Procedure(s) (LRB) with comments: OPEN REDUCTION INTERNAL FIXATION (ORIF) MANDIBULAR FRACTURE (N/A) REPAIR MULTIPLE LACERATIONS () - Repair of chin laceration  Patient Location: PACU  Anesthesia Type: General  Level of Consciousness: awake, oriented and patient cooperative  Airway & Oxygen Therapy: Patient Spontanous Breathing and Patient connected to nasal cannula oxygen  Post-op Assessment: Report given to PACU RN and Post -op Vital signs reviewed and stable  Post vital signs: Reviewed and stable  Complications: No apparent anesthesia complications

## 2012-04-14 NOTE — Progress Notes (Signed)
Patient was found vomiting clear blood tinged fluids. He was grasping at his neck trying to spit. I proceeded to cut the wires to both sides of his jaw and set up suction at the bedside. Patient was crying and anxious. Phenergan 12.5 and 2 mg morphine given for pain control and nausea. Patient complained that his tongue was numb and felt "big". Patient tongue did appear to be swollen but was moving air adequately. Sats was 92% on RA. Dr. Emeline Darling paged and made aware. We are awaiting his arrival.

## 2012-04-14 NOTE — Transfer of Care (Signed)
Immediate Anesthesia Transfer of Care Note  Patient: Gabriel Nguyen  Procedure(s) Performed: Procedure(s) (LRB) with comments: TRACHEOSTOMY (N/A)  Patient Location: PACU  Anesthesia Type: General  Level of Consciousness: awake  Airway & Oxygen Therapy: Patient Spontanous Breathing and Patient connected to tracheostomy mask oxygen  Post-op Assessment: Report given to PACU RN and Post -op Vital signs reviewed and stable  Post vital signs: Reviewed and stable  Complications: No apparent anesthesia complications

## 2012-04-15 ENCOUNTER — Inpatient Hospital Stay (HOSPITAL_COMMUNITY): Payer: 59

## 2012-04-15 NOTE — Progress Notes (Addendum)
Speech Language Pathology  Patient Details Name: Gabriel Nguyen MRN: [id number] DOB: 04-Nov-1990 Today's Date: 04/15/2012 Time:  -    Pt. Was not appropriate for swallow assessment yesterday due to copious secretions. Today SLP reviewed chart and spoke with RN who was told in report that swallow and Passy-Muir speaking valve was wanted.  PMSV would benefit pt. prior to swallow assessment, however there is no order for speaking valve.  RN reports pt. continues to have significant edema.  Dr. Raye Sorrow note from yesterday stated plans to start NGT feeds today.  This SLP called Dr. Raye Sorrow cell phone and left message requesting order for speaking valve if desired and/or to proceed with swallow assessment.  Info relayed to RN.  Breck Coons [place].Ed [employer] [phone]  04/15/2012

## 2012-04-15 NOTE — Progress Notes (Addendum)
INITIAL ADULT NUTRITION ASSESSMENT Date: 04/15/2012   Time: 4:57 PM Reason for Assessment: MD consult  INTERVENTION: 1. Recommend Ensure Complete po QID, each supplement provides 350 kcal and 13 grams of protein.  2. Recommend Resource Breeze po QID, each supplement provides 250 kcal and 9 grams of protein.  3. Discussed with pt importance of supplements to meet his estimated nutrition needs.    DOCUMENTATION CODES Per approved criteria  -Not Applicable    ASSESSMENT: Male 21 y.o.  Dx: comminuted mandible fracture  Hx: History reviewed. No pertinent past medical history. History reviewed. No pertinent past surgical history.  Related Meds:  Scheduled Meds:    . bacitracin   Topical TID  .  ceFAZolin (ANCEF) IV  1 g Intravenous Q8H  . dexamethasone  10 mg Intravenous Q8H   Continuous Infusions:    . dextrose 5 % and 0.45% NaCl 75 mL/hr (04/14/12 2244)   PRN Meds:.acetaminophen (TYLENOL) oral liquid 160 mg/5 mL, ALPRAZolam, diphenhydrAMINE, HYDROcodone-acetaminophen, morphine injection, ondansetron (ZOFRAN) IV, ondansetron, promethazine  Ht: 6\' 5"  (195.6 cm)  Wt: 170 lb (77.111 kg)  Ideal Wt: 94.5 kg % Ideal Wt: 82%  Usual Wt:  Wt Readings from Last 10 Encounters:  04/13/12 170 lb (77.111 kg)  04/13/12 170 lb (77.111 kg)  04/13/12 170 lb (77.111 kg)   % Usual Wt: 100%  Body mass index is 20.16 kg/(m^2). WNL  Food/Nutrition Related Hx: Pt reports no recent wt changes, good appetite PTA.   Labs:  CMP     Component Value Date/Time   NA 137 04/14/2012 0428   K 4.1 04/14/2012 0428   GLUCOSE 266* 04/14/2012 0428    Intake/Output Summary (Last 24 hours) at 04/15/12 1657 Last data filed at 04/15/12 1600  Gross per 24 hour  Intake   2050 ml  Output   2200 ml  Net   -150 ml   Diet Order: NPO  Supplements/Tube Feeding: none  IVF:     dextrose 5 % and 0.45% NaCl Last Rate: 75 mL/hr (04/14/12 9024)   21 year old admitted with bilateral subcondylar  and right parasymphyseal mandible fractures after falling after giving blood and striking his jaw/face on the ground. POD # 2 s/p Open treatment of mandibular fracture; with interdental fixation and complex repair of 5 cm right chin laceration. Pt had tongue edema and airway distress and underwent emergency trach on 10/11.  NGT d/c'ed and trach cuff downsized. Pt with wired jaw.  Spoke with RN who reports that pt no longer needs a swallow eval. RN reports that she can deflate cuff to allow pt to eat. RN to address diet advancement with MD. Discussed supplement choices to pt and importance of consuming to meet needs on limited diet.   Estimated Nutritional Needs:   Kcal:  2500-2700  Protein:  100-120 grams Fluid:  >2.5 L/day  NUTRITION DIAGNOSIS: -Increased nutrient needs r/t recent trauma/surgery AEB estimated needs.   MONITORING/EVALUATION(Goals): Goal: Pt to meet >/= 90% of their estimated nutrition needs. Monitor: PO intake, supplement acceptance, weight  EDUCATION NEEDS: -Education needs addressed   Kendell Bane RD, LDN, CNSC [phone] Weekend/After Hours Pager  04/15/2012, 4:57 PM

## 2012-04-15 NOTE — Progress Notes (Signed)
04/15/2012 12:40 PM  Gabriel Nguyen [id number]  Post-Op Day 2    Temp:  [97.8 F (36.6 C)-99 F (37.2 C)] 97.8 F (36.6 C) (10/12 1204) Pulse Rate:  [68-106] 74  (10/12 1200) Resp:  [12-33] 19  (10/12 1200) BP: (128-158)/(74-114) 133/78 mmHg (10/12 1200) SpO2:  [98 %-100 %] 100 % (10/12 1200) FiO2 (%):  [28 %-35 %] 28 % (10/12 1144),     Intake/Output Summary (Last 24 hours) at 04/15/12 1240 Last data filed at 04/15/12 1200  Gross per 24 hour  Intake   2130 ml  Output   1850 ml  Net    280 ml    Panorex:  Occlusion intact, fractures with good alignment.  No results found for this or any previous visit (from the past 24 hour(s)).  SUBJECTIVE:  Pain reasonable.  Still suctioning secretions.    OBJECTIVE:  Min facial swelling.  Trach secure. Breathing well.  IMPRESSION:  Satisfactory check  PLAN:  D/C NG.  Advance activity.  Trach cuff down and begin [employer] speaking trial. Hopefully can begin oral diet soon.  [name]

## 2012-04-16 MED ORDER — CHLORHEXIDINE GLUCONATE 0.12 % MT SOLN
5.0000 mL | Freq: Four times a day (QID) | OROMUCOSAL | Status: DC
  Administered 2012-04-16 – 2012-04-19 (×9): 5 mL via OROMUCOSAL
  Filled 2012-04-16 (×9): qty 15

## 2012-04-16 NOTE — Progress Notes (Signed)
Pt sounded as if he needed suction, but refused at this time. Given gauze to wipe up any secretions brought up by spontaneous coughh.

## 2012-04-16 NOTE — Care Management Note (Addendum)
    Page 1 of 2   04/17/2012     3:07:10 PM   CARE MANAGEMENT NOTE 04/17/2012  Patient:  DIARRA, MIURA   Account Number:  [id number]  Date Initiated:  04/16/2012  Documentation initiated by:  Fransico Michael  Subjective/Objective Assessment:   admitted on 04/13/12 with complicated mandible fractures.     Action/Plan:   prior to admission, patient lived at home with support of parent. Independent with ADLs.   Anticipated DC Date:  04/19/2012   Anticipated DC Plan:  HOME W HOME HEALTH SERVICES      DC Planning Services  CM consult      Choice offered to / List presented to:  C-1 Patient   DME arranged  SUCTION  TRACH SUPPLIES  OTHER - SEE COMMENT      DME agency  [facility]     HH arranged  HH-1 RN      [facility] agency  [facility]   Status of service:  Completed, signed off Medicare Important Message given?   (If response is "NO", the following Medicare IM given date fields will be blank) Date Medicare IM given:   Date Additional Medicare IM given:    Discharge Disposition:  HOME W HOME HEALTH SERVICES  Per UR Regulation:  Reviewed for med. necessity/level of care/duration of stay  If discussed at Long Length of Stay Meetings, dates discussed:    Comments:  04-17-12 Met with patient , his mother and grand mother. Facesheet information correct except patient does have [facility]  Patient lives with mother who is willing to learn and assist patient with trach.  RT to work with patient and family this afternoon regarding trach care. Patient picked [facility] . referral made.   Ronny Flurry RN BSN [phone]    04-17-12 Patient will discharge with trach , plan to remove trach  in 4 to 6 weeks when his hardware is removed. Patient currently in PACU for having revision of closed reduction. Will visit patient later today.  Ronny Flurry RN BSN [phone]    04/13/12-1319-J.Lutricia Horsfall [phone]     21yo male patient admitted on  04/13/12 with complicated mandibular fractures. Patient fell after giving blood and jaw/face hit floor. Prior to admission, patient lived at home with parental support. Independent with ADLs. Speech eval not noted yet. No needs identified at this time. CM will continue to monitor.

## 2012-04-16 NOTE — Progress Notes (Signed)
04/16/2012 1:08 PM  Gabriel Nguyen [id number]  Post-Op Day 3    Temp:  [98 F (36.7 C)-99.9 F (37.7 C)] 99.1 F (37.3 C) (10/13 1153) Pulse Rate:  [72-117] 73  (10/13 1302) Resp:  [12-30] 14  (10/13 1302) BP: (120-159)/(65-95) 131/84 mmHg (10/13 1302) SpO2:  [99 %-100 %] 99 % (10/13 1302) FiO2 (%):  [28 %] 28 % (10/13 1302),     Intake/Output Summary (Last 24 hours) at 04/16/12 1308 Last data filed at 04/16/12 1100  Gross per 24 hour  Intake   1500 ml  Output   2000 ml  Net   -500 ml    No results found for this or any previous visit (from the past 24 hour(s)).  SUBJECTIVE:  C/o lg pain with minor trach manipulation.  Refusing oral hygiene per nurses.  Trach cuff apparently not deflated yesterday.  Could not begin [employer] trial yest.  Not interested in getting out of bed.  OBJECTIVE:  Awake, alert.  Cuff deflated.  Not able to phonate much with finger occlusion.  IMPRESSION:  Satisfactory check.  PLAN:  Out of ICU.  Possible trach downsize tomorrow. Speech Path for [employer] tomorrow.  Begin po sips, possible po liquid meds.  [name]

## 2012-04-17 ENCOUNTER — Encounter (HOSPITAL_COMMUNITY): Payer: Self-pay | Admitting: Anesthesiology

## 2012-04-17 ENCOUNTER — Inpatient Hospital Stay (HOSPITAL_COMMUNITY): Payer: 59 | Admitting: Anesthesiology

## 2012-04-17 ENCOUNTER — Encounter (HOSPITAL_COMMUNITY)
Admission: EM | Disposition: A | Discharge status: Home Health | Payer: Self-pay | Source: Home / Self Care | Attending: Otolaryngology

## 2012-04-17 HISTORY — PX: CLOSED REDUCTION MANDIBLE: SHX5307

## 2012-04-17 SURGERY — CLOSED REDUCTION, MANDIBLE
Anesthesia: General | Site: Mouth | Wound class: Contaminated

## 2012-04-17 MED ORDER — OXYCODONE HCL 5 MG PO TABS: 5.0000 mg | ORAL_TABLET | Freq: Once | ORAL | Status: DC | PRN

## 2012-04-17 MED ORDER — ONDANSETRON HCL 4 MG/2ML IJ SOLN
INTRAMUSCULAR | Status: AC
  Filled 2012-04-17: qty 2

## 2012-04-17 MED ORDER — MIDAZOLAM HCL 5 MG/5ML IJ SOLN
INTRAMUSCULAR | Status: DC | PRN
  Administered 2012-04-17: 2 mg via INTRAVENOUS

## 2012-04-17 MED ORDER — FENTANYL CITRATE 0.05 MG/ML IJ SOLN
INTRAMUSCULAR | Status: DC | PRN
  Administered 2012-04-17 (×3): 50 ug via INTRAVENOUS

## 2012-04-17 MED ORDER — ONDANSETRON HCL 4 MG/2ML IJ SOLN: 4.0000 mg | Freq: Once | INTRAMUSCULAR | Status: DC

## 2012-04-17 MED ORDER — LACTATED RINGERS IV SOLN
INTRAVENOUS | Status: DC | PRN
  Administered 2012-04-17: 09:00:00 via INTRAVENOUS

## 2012-04-17 MED ORDER — OXYCODONE HCL 5 MG/5ML PO SOLN: 5.0000 mg | Freq: Once | ORAL | Status: DC | PRN

## 2012-04-17 MED ORDER — LIDOCAINE-EPINEPHRINE (PF) 1 %-1:200000 IJ SOLN
INTRAMUSCULAR | Status: DC | PRN
  Administered 2012-04-17: 10 mL

## 2012-04-17 MED ORDER — LIDOCAINE-EPINEPHRINE (PF) 1 %-1:200000 IJ SOLN
INTRAMUSCULAR | Status: AC
  Filled 2012-04-17: qty 10

## 2012-04-17 MED ORDER — HYDROMORPHONE HCL PF 1 MG/ML IJ SOLN: 0.2500 mg | INTRAMUSCULAR | Status: DC | PRN

## 2012-04-17 MED ORDER — ONDANSETRON HCL 4 MG/2ML IJ SOLN
INTRAMUSCULAR | Status: DC | PRN
  Administered 2012-04-17: 4 mg via INTRAVENOUS

## 2012-04-17 MED ORDER — DROPERIDOL 2.5 MG/ML IJ SOLN: 0.6250 mg | INTRAMUSCULAR | Status: DC | PRN

## 2012-04-17 SURGICAL SUPPLY — 32 items
BLADE SURG 15 STRL LF DISP TIS (BLADE) IMPLANT
BLADE SURG 15 STRL SS (BLADE)
CANISTER SUCTION 2500CC (MISCELLANEOUS) ×2 IMPLANT
CLOTH BEACON ORANGE TIMEOUT ST (SAFETY) ×2 IMPLANT
COVER SURGICAL LIGHT HANDLE (MISCELLANEOUS) ×2 IMPLANT
CRADLE DONUT ADULT HEAD (MISCELLANEOUS) ×1 IMPLANT
DECANTER SPIKE VIAL GLASS SM (MISCELLANEOUS) IMPLANT
DRAPE PROXIMA HALF (DRAPES) ×2 IMPLANT
GAUZE SPONGE 4X4 16PLY XRAY LF (GAUZE/BANDAGES/DRESSINGS) IMPLANT
GLOVE BIO SURGEON STRL SZ7.5 (GLOVE) ×2 IMPLANT
GLOVE BIOGEL PI IND STRL 6 (GLOVE) IMPLANT
GLOVE BIOGEL PI IND STRL 7.0 (GLOVE) IMPLANT
GLOVE BIOGEL PI INDICATOR 6 (GLOVE) ×1
GLOVE BIOGEL PI INDICATOR 7.0 (GLOVE) ×2
GLOVE SURG SS PI 7.5 STRL IVOR (GLOVE) ×1 IMPLANT
GOWN STRL NON-REIN LRG LVL3 (GOWN DISPOSABLE) ×5 IMPLANT
KIT BASIN OR (CUSTOM PROCEDURE TRAY) ×1 IMPLANT
KIT ROOM TURNOVER OR (KITS) ×2 IMPLANT
KIT SUCTION CATH 14FR (SUCTIONS) ×1 IMPLANT
NEEDLE 27GAX1X1/2 (NEEDLE) IMPLANT
NS IRRIG 1000ML POUR BTL (IV SOLUTION) ×2 IMPLANT
PAD ARMBOARD 7.5X6 YLW CONV (MISCELLANEOUS) ×4 IMPLANT
SUCTION FRAZIER TIP 10 FR DISP (SUCTIONS) ×1 IMPLANT
SUT CHROMIC 3 0 PS 2 (SUTURE) IMPLANT
SUT CHROMIC 4 0 PS 2 18 (SUTURE) IMPLANT
SUT VICRYL 4-0 PS2 18IN ABS (SUTURE) ×1 IMPLANT
SYR CONTROL 10ML LL (SYRINGE) IMPLANT
TRAY ENT MC OR (CUSTOM PROCEDURE TRAY) ×2 IMPLANT
TUBE CONNECTING 12X1/4 (SUCTIONS) ×2 IMPLANT
TUBE TRACH SHILEY  6 DIST  CUF (TUBING) ×1 IMPLANT
WATER STERILE IRR 1000ML POUR (IV SOLUTION) ×2 IMPLANT
YANKAUER SUCT BULB TIP NO VENT (SUCTIONS) ×1 IMPLANT

## 2012-04-17 NOTE — Op Note (Signed)
04/17/2012  10:26 AM    Gabriel Nguyen  [id number]   Pre-Op Dx:    Mandible fracture s/p ORIF/MMF with ruptured dental elastics and need for replacement of interdental wires  Post-op Dx: Mandible fracture s/p ORIF/MMF with ruptured dental elastics and need for replacement of interdental wires   Proc: [phone] revision closed reduction of bilateral mandibular subcondylar fractures with Mandibulo-maxillary fixation/interdental wiring  Surg:  Melvenia Beam  Anes:  General via tracheotomy  EBL:  minimal  Comp:  none  Findings:  Class I post-op occlusion, 4 cuffless trach replaced.  Procedure: With the patient in a comfortable supine position, general endotracheal anesthesia was induced without difficulty through the patient's 4 cuffless tracheotomy tube, which was then switched out for a temporary 6 cuffed trach which was secured with velcro ties and connected to the ventilator circuit.  The 8 post-MMF screws were in good position and the patient was in class I occlusion, but his dental elastics had all broken except for one band on each set of screws. As this was not sufficient to hold him in satisfactory MMF, the scissors and Adson forceps were used to remove the dental elastics. Of note inspection of the floor of mouth/sublingual space showed significantly improved/decreased edema, but there was still some hematoma present. I used the pickle fork to ensure that the tongue was free of the dentition and then the mandible was remanipulated into his native class I occlusion. He was held in class I occlusion while 22 gauge wires were replace into the 8 post screws, 2 wires per side. He was then secured in class I occlusion with the MMF wires in the standard fashion. I injected 1% lidocaine with epinephrine into the screw sites for hemostasis and post-op comfort. I placed an additional 2 vicryl mattress sutures into his right inferior sublabial incision. I then irrigated and suctioned out the oral  cavity and suctioned out the stomach and oral cavity with a flexible nasal suction catheter. I then removed the 6 cuffed trach and replaced his 4 cuffless tracheostomy tube and secured it with the velcro ties.  The patient was returned to anesthesia, fully awakened and transferred to the postoperative recovery area in stable condition.  Dr. Melvenia Beam was present and performed the entire procedure.    Melvenia Beam 10:26 AM 04/17/12

## 2012-04-17 NOTE — Progress Notes (Signed)
Subjective: POD#4 from ORIF mandible fracture, POD#3 from emergent tracheotomy. Patient doing better, feels tongue less swollen. Feels like teeth are mobile however. Taking PO fluids through straw without coughing.  Objective: Vital signs in last 24 hours: Temp:  [97.4 F (36.3 C)-99.1 F (37.3 C)] 98.6 F (37 C) (10/14 0614) Pulse Rate:  [73-117] 85  (10/14 0614) Resp:  [12-22] 19  (10/14 0614) BP: (124-142)/(71-113) 128/72 mmHg (10/14 0614) SpO2:  [98 %-100 %] 98 % (10/14 0614) FiO2 (%):  [25 %-28 %] 28 % (10/14 1610)  Procedure note: tracheotomy tube change 31502-removed silk sutures and Bjork suture. Removed velcro trach ties. Removed old trach and suctioned tracheotomy. Replaced new 4 cuffless trach without difficulty and secured with velcro trach ties. Replaced trach collar oxygen. Patient tolerated procedure without difficulty.  Examined oral cavity. In Class I occlusion but all of dental elastics have broken except for 2 on each side. I removed the broken elastics. Sublabial incision is intact with absorbable sutures. CN 2-12 intact and symmetric. Able to voice with finger occlusion with trach and abel to take some PO fluids through straw.  @LABLAST2 (wbc:2,hgb:2,hct:2,plt:2) No results found for this basename: NA:2,K:2,CL:2,CO2:2,GLUCOSE:2,BUN:2,CREATININE:2,CALCIUM:2 in the last 72 hours  Medications:  No current facility-administered medications on file prior to encounter.   No current outpatient prescriptions on file prior to encounter.    Assessment/Plan: POD#4 from MMF/ORIF mandible, POD#3 from trach. Changed trach to 4 cuffless. Continue wired jaw diet, ordered passy muir speaking valve from Speech pathology. Will remove trach when patient comes back for hardware removal in 4-6 weeks. Will need trach supplies and spare trachs/portable suction to take home.  Dental Elastics have mostly broken. Will need to go back to OR this morning for replacement of mandible wires.  Informed written consent on chart.   LOS: 4 days   Melvenia Beam 04/17/2012, 7:50 AM

## 2012-04-17 NOTE — Anesthesia Procedure Notes (Signed)
Procedure Name: Intubation Date/Time: 04/17/2012 9:30 AM Performed by: Rossie Muskrat L Pre-anesthesia Checklist: Patient identified, Timeout performed, Emergency Drugs available, Suction available and Patient being monitored Patient Re-evaluated:Patient Re-evaluated prior to inductionOxygen Delivery Method: Circle system utilized Preoxygenation: Pre-oxygenation with 100% oxygen Intubation Type: IV induction and Tracheostomy Airway Equipment and Method: Tracheostomy Placement Confirmation: breath sounds checked- equal and bilateral and positive ETCO2 Dental Injury: Teeth and Oropharynx as per pre-operative assessment

## 2012-04-17 NOTE — Progress Notes (Signed)
Report given to angel rn as caregiver 

## 2012-04-17 NOTE — Preoperative (Signed)
Beta Blockers   Reason not to administer Beta Blockers:not applicable

## 2012-04-17 NOTE — Transfer of Care (Signed)
Immediate Anesthesia Transfer of Care Note  Patient: Gabriel Nguyen  Procedure(s) Performed: Procedure(s) (LRB) with comments: CLOSED REDUCTION MANDIBULAR (N/A)  Patient Location: PACU  Anesthesia Type: General  Level of Consciousness: awake, alert  and oriented  Airway & Oxygen Therapy: Patient Spontanous Breathing and Patient connected to T-piece oxygen  Post-op Assessment: Report given to PACU RN, Post -op Vital signs reviewed and stable and Patient moving all extremities  Post vital signs: Reviewed and stable  Complications: No apparent anesthesia complications

## 2012-04-17 NOTE — Evaluation (Signed)
Passy-Muir Speaking Valve - Evaluation Patient Details  Name: Gabriel Nguyen MRN: [id number] Date of Birth: March 11, 1991  Today's Date: 04/17/2012 Time: 8295-6213 SLP Time Calculation (min): 28 min  Past Medical History: History reviewed. No pertinent past medical history. Past Surgical History:  Past Surgical History  Procedure Date  . Tracheostomy tube placement 04/14/2012    Procedure: TRACHEOSTOMY;  Surgeon: Melvenia Beam, MD;  Location: [facility] OR;  Service: ENT;  Laterality: N/A;   HPI:  21 year old who presents with bilateral subcondylar and right parasymphyseal mandible fractures after falling after giving blood and striking his jaw/face on the ground s/p repair. Less than 24 hours post op, patient developed respiratory distress and difficulty managing secretions requiring emergent tracheotomy. Patient has sinced been downsized to a number 4.0 cuffless shiley. Mandible wired shut.    Assessment / Plan / Recommendation Clinical Impression  Patient presents with great tolerance of PMSV without indication of drop in O2 saturation levels or changes in RR or HR. Vocal quality clear and strong with wiring of the mandible being the primary contributing factor to decreased speech intelligibility at this time. Patient encouraged however to slow rate, increase volume, overarticulate in attempts to verbally communicate. Patient and mother returned demonstration of PMSV placement and removal and verbalized understanding of contraindications and cleaning procedures. SLP will f/u at bedside for tolerance and continued education.     SLP Assessment  Patient needs continued Speech Lanaguage Pathology Services    Follow Up Recommendations  None    Frequency and Duration min 2x/week  2 weeks   Pertinent Vitals/Pain n/a    SLP Goals Potential to Achieve Goals: Good Progress/Goals/Alternative treatment plan discussed with pt/caregiver and they: Agree SLP Goal #1: Patient will utilize PMSV during  all waking hours without distress and decline in vital signs.  SLP Goal #1 - Progress: Not met SLP Goal #2: Patient will reach 80% intelligible at the conversation level with mod independence with use of speech intelligibility strategies SLP Goal #2 - Progress: Not met SLP Goal #3: Patient will independently demonstrates proper placement and removal of PMSV.  SLP Goal #3 - Progress: Not met   PMSV Trial  PMSV was placed for: 25 minutes Able to redirect subglottic air through upper airway: Yes Able to Attain Phonation: Yes Voice Quality: Normal Able to Expectorate Secretions:  (not observed) Level of Secretion Expectoration with PMSV: Oral Breath Support for Phonation: Adequate Intelligibility: Intelligibility reduced Word: 75-100% accurate Phrase: 75-100% accurate Sentence: 75-100% accurate Conversation: 75-100% accurate SpO2 During Trial: 100 % Pulse During Trial: 80  Behavior: Alert;Controlled;Cooperative;Expresses self well   Tracheostomy Tube  Additional Tracheostomy Tube Assessment Fenestrated: No Trach Collar Period: 24 hours/day Secretion Description: thin oral saliva only pooling anteriorly, manually suctioned due to jaw being wired shut Level of Secretion Expectoration: Oral    Vent Dependency  Vent Dependent: No FiO2 (%): 28 %    Cuff Deflation Trial Behavior: Alert;Controlled;Cooperative;Expresses self well   Ferdinand Lango MA, CCC-SLP [phone]   [name] 04/17/2012, 3:26 PM

## 2012-04-17 NOTE — Anesthesia Preprocedure Evaluation (Signed)
Anesthesia Evaluation  Patient identified by MRN, date of birth, ID band Patient awake    Reviewed: Allergy & Precautions, H&P , NPO status , Patient's Chart, lab work & pertinent test results  History of Anesthesia Complications Negative for: history of anesthetic complications  Airway      Comment: Tracheostomy in place Dental  (+) Teeth Intact   Pulmonary Current Smoker,    Pulmonary exam normal       Cardiovascular negative cardio ROS      Neuro/Psych negative neurological ROS  negative psych ROS   GI/Hepatic negative GI ROS, Neg liver ROS,   Endo/Other  negative endocrine ROS  Renal/GU negative Renal ROS     Musculoskeletal   Abdominal   Peds  Hematology negative hematology ROS (+)   Anesthesia Other Findings   Reproductive/Obstetrics                           Anesthesia Physical Anesthesia Plan  ASA: III  Anesthesia Plan: General   Post-op Pain Management:    Induction:   Airway Management Planned: Tracheostomy  Additional Equipment:   Intra-op Plan:   Post-operative Plan:   Informed Consent:   Plan Discussed with: CRNA, Anesthesiologist and Surgeon  Anesthesia Plan Comments:         Anesthesia Quick Evaluation

## 2012-04-17 NOTE — Anesthesia Postprocedure Evaluation (Signed)
Anesthesia Post Note  Patient: Gabriel Nguyen  Procedure(s) Performed: Procedure(s) (LRB): CLOSED REDUCTION MANDIBULAR (N/A)  Anesthesia type: general  Patient location: PACU  Post pain: Pain level controlled  Post assessment: Patient's Cardiovascular Status Stable  Last Vitals:  Filed Vitals:   04/17/12 1030  BP: 146/87  Pulse: 100  Temp:   Resp: 21    Post vital signs: Reviewed and stable  Level of consciousness: sedated  Complications: No apparent anesthesia complications

## 2012-04-18 ENCOUNTER — Encounter (HOSPITAL_COMMUNITY): Payer: Self-pay | Admitting: Otolaryngology

## 2012-04-18 NOTE — Progress Notes (Signed)
Subjective: POD#5 from ORIF/MMF mandible, POD#4 from emergent tracheotomy for tongue swelling/airway distress. Used passy muir briefly yesterday, taking some PO fluids with straw without difficulty  Objective: Vital signs in last 24 hours: Temp:  [97.3 F (36.3 C)-99.9 F (37.7 C)] 97.8 F (36.6 C) (10/15 0509) Pulse Rate:  [67-104] 80  (10/15 0509) Resp:  [11-25] 18  (10/15 0509) BP: (107-146)/(70-104) 118/70 mmHg (10/15 0509) SpO2:  [97 %-100 %] 100 % (10/15 0509) FiO2 (%):  [0 %-28 %] 0 % (10/14 1800)  Class I occlusion with 22 gauge wires and 8 post MMF intact. Intelligible voice with trach finger occlusion. 4 cuffless in place secure with velcro ties. Cn 2-12 intact and symmetric.  @LABLAST2 (wbc:2,hgb:2,hct:2,plt:2) No results found for this basename: NA:2,K:2,CL:2,CO2:2,GLUCOSE:2,BUN:2,CREATININE:2,CALCIUM:2 in the last 72 hours  Medications:  Scheduled Meds:   . bacitracin   Topical TID  .  ceFAZolin (ANCEF) IV  1 g Intravenous Q8H  . chlorhexidine  5 mL Mouth/Throat QID  . ondansetron      . DISCONTD: ondansetron (ZOFRAN) IV  4 mg Intravenous Once   Continuous Infusions:   . dextrose 5 % and 0.45% NaCl 75 mL/hr at 04/18/12 0557   PRN Meds:.acetaminophen (TYLENOL) oral liquid 160 mg/5 mL, ALPRAZolam, diphenhydrAMINE, HYDROcodone-acetaminophen, morphine injection, ondansetron (ZOFRAN) IV, ondansetron, promethazine, DISCONTD: droperidol, DISCONTD:  HYDROmorphone (DILAUDID) injection, DISCONTD: lidocaine-EPINEPHrine, DISCONTD: oxyCODONE, DISCONTD: oxyCODONE  Assessment/Plan: Improving after MMF/ORIF comminuted mandible fracture and emergent trach. Had wires replaced yesterday in OR as dental elastics were not holding. Occlusion looks good. Will D/C when taking adequate PO and home health for trach set up and comfortable with trach care and passy muir valve. Rx are on chart.   LOS: 5 days   Melvenia Beam 04/18/2012, 7:55 AM

## 2012-04-18 NOTE — Progress Notes (Signed)
Speech Language Pathology Treatment Patient Details Name: Gabriel Nguyen MRN: [id number] DOB: [birth year] Today's Date: 04/18/2012 Time: 8295-6213 SLP Time Calculation (min): 20 min  Assessment / Plan / Recommendation Clinical Impression  Patient continues to present with great tolerance of PMSV with clear, strong phonation, independent placement and removal of valve and supervision cues to slow rate of speech for increased intelligibility due to mandibular wiring. SLP will continue to f/u for continued education prior to d/c home.        Pertinent Vitals/Pain n/a  SLP Goals  SLP Goals Potential to Achieve Goals: Good Progress/Goals/Alternative treatment plan discussed with pt/caregiver and they: Agree SLP Goal #1: Patient will utilize PMSV during all waking hours without distress and decline in vital signs.  SLP Goal #1 - Progress: Progressing toward goal SLP Goal #2: Patient will reach 80% intelligible at the conversation level with mod independence with use of speech intelligibility strategies SLP Goal #2 - Progress: Progressing toward goal SLP Goal #3: Patient will independently demonstrates proper placement and removal of PMSV.  SLP Goal #3 - Progress: Progressing toward goal  Gabriel Lango MA, CCC-SLP [phone]  Gabriel Nguyen 04/18/2012, 4:11 PM

## 2012-04-19 ENCOUNTER — Encounter (HOSPITAL_COMMUNITY): Payer: Self-pay | Admitting: Otolaryngology

## 2012-04-19 NOTE — Progress Notes (Signed)
Reviewed Trach Education w/ PT. Pt demonstrated cleaning & replacing IC.  Pt reports he suctions himself frequently. PT states he will have home health nurse at [place].

## 2012-04-19 NOTE — Progress Notes (Signed)
Discharge instructions reviewed with pt and pt's mother and prescriptions given.  Pt and pt's mother verbalized understanding and had no questions.  Pt verbalizes understanding of trach care.  Wire cutters and needed trach supplies sent home with pt.  HH has been arranged.  Pt discharged in stable condition via wheelchair with mother.  Hector Shade Gonzalez

## 2012-04-19 NOTE — Discharge Summary (Signed)
04/19/2012 7:40 AM  Date of Admission: 04/13/12 Date of Discharge:04/19/12  Discharge YN:WGNF, [name]  Admitting AO:ZHYQ, [name]  Reason for admission/final discharge diagnosis: bilateral subcondylar mandible fractures, right para symphyseal mandible fracture, right chin laceration, airway distress/tongue edema requiring tracheotomy.   Procedure(s) performed: 04/13/12-ORIF mandible fractures with interdental wiring 04/14/12-emergency tracheotomy for tongue edema/airway distress, placement of dental elastics 04/17/12-revision maxillomandibular fixation/replacement of MMF wires for closed reduction of mandible fractures, tracheotomy tube change  Discharge Condition: stable  Discharge Exam: CN 2-12 intact, MMF intact with 8-post screws and wires, class I occlusion with the gingivobuccal cusps of the maxillary first molars intercuspating with the buccal grooves of the mandibular first molars. 4 cuffless trach in place and patent/secure, intelligible voice with finger occlusion or passy-muir valve, good sat's and breathing comfortably.  Discharge Instructions: Rx on chart for trach supplies, amoxicillin, phenergan, hydrocodone/acetaminophen/chlorhexidine mouth rinse, follow up with Dr. Emeline Darling in one week. Trach care and suctioning per routine. Give wire cutters to take home. Wired jaw/dental liquid diet ad lib. Dress chin incision with OTC Neosporin TID.  Hospital Course: patient had syncopal event after giving blood on 04/13/12. Fell on face, suffered bilateral subcondylar fractures  And right parasymphyseal fracture/right chin laceration. Taken for MMF/ORIF on 04/13/12. Vomited and had tongue edema early morning of 04/14/12 so nurses cut wires and Dr. Emeline Darling took patient back to OR for tracheotomy and dental elastics for MMR. Dental elastics degraded over weekend so on 04/17/12 he was taken back for trach change to 4 cuffless and replacement of his MMF wires. Did well post-op, in class I  occlusion, learned trach care/wire cutter use, breathing well, tongue edema improved, and was taken adequate wired jaw diet. Discharged on 04/19/12 on POD#6 in good condition.  Melvenia Beam 7:40 AM 04/19/2012

## 2012-04-21 DIAGNOSIS — Z930 Tracheostomy status: Secondary | ICD-10-CM | POA: Insufficient documentation

## 2012-04-21 DIAGNOSIS — R05 Cough: Secondary | ICD-10-CM | POA: Insufficient documentation

## 2012-04-21 DIAGNOSIS — F17200 Nicotine dependence, unspecified, uncomplicated: Secondary | ICD-10-CM | POA: Insufficient documentation

## 2012-04-21 DIAGNOSIS — F411 Generalized anxiety disorder: Secondary | ICD-10-CM | POA: Insufficient documentation

## 2012-04-21 DIAGNOSIS — R0789 Other chest pain: Secondary | ICD-10-CM | POA: Insufficient documentation

## 2012-04-21 DIAGNOSIS — R059 Cough, unspecified: Secondary | ICD-10-CM | POA: Insufficient documentation

## 2012-04-21 DIAGNOSIS — IMO0001 Reserved for inherently not codable concepts without codable children: Secondary | ICD-10-CM | POA: Insufficient documentation

## 2012-04-22 ENCOUNTER — Emergency Department (HOSPITAL_COMMUNITY): Payer: 59

## 2012-04-22 ENCOUNTER — Encounter (HOSPITAL_COMMUNITY): Payer: Self-pay | Admitting: Emergency Medicine

## 2012-04-22 ENCOUNTER — Emergency Department (HOSPITAL_COMMUNITY)
Admission: EM | Admit: 2012-04-22 | Discharge: 2012-04-22 | Disposition: A | Discharge status: Home / Self Care | Payer: 59 | Attending: Emergency Medicine | Admitting: Emergency Medicine

## 2012-04-22 DIAGNOSIS — F410 Panic disorder [episodic paroxysmal anxiety] without agoraphobia: Secondary | ICD-10-CM

## 2012-04-22 DIAGNOSIS — T797XXA Traumatic subcutaneous emphysema, initial encounter: Secondary | ICD-10-CM

## 2012-04-22 LAB — BASIC METABOLIC PANEL
CO2: 26 mEq/L (ref 19–32)
Chloride: 100 mEq/L (ref 96–112)
GFR calc Af Amer: >90 mL/min (ref >90)
Potassium: 3.8 mEq/L (ref 3.5–5.1)
Sodium: 140 mEq/L (ref 135–145)

## 2012-04-22 MED ORDER — LORAZEPAM 2 MG/ML IJ SOLN
2.0000 mg | Freq: Once | INTRAMUSCULAR | Status: AC
  Administered 2012-04-22: 2 mg via INTRAMUSCULAR
  Filled 2012-04-22: qty 1

## 2012-04-22 NOTE — ED Provider Notes (Signed)
Medical screening examination/treatment/procedure(s) were conducted as a shared visit with non-physician practitioner(s) and myself.  I personally evaluated the patient during the encounter  Well-appearing no symptoms at this time.  EKG normal sinus rhythm with early repolarization.    Date: 04/22/2012  Rate: 95  Rhythm: normal sinus rhythm  QRS Axis: normal  Intervals: normal  ST/T Wave abnormalities: early repolarization  Conduction Disutrbances: none  Narrative Interpretation:   Old EKG Reviewed: No prior ecg  i spoke with Dr Jenne Pane, ENT regarding the subcutaneous air.  He states that is not surprisingly has little subcutaneous air 8 days out post tracheostomy.  The patient has been coughing on his trach which is likely a source for the subcutaneous air.  No pneumothorax.  Well-appearing.`   I personally reviewed the imaging tests through PACS system  I reviewed available ER/hospitalization records thought the EMR Dg Orthopantogram  04/15/2012  *RADIOLOGY REPORT*  Clinical Data: Mandible fracture  ORTHOPANTOGRAM/PANORAMIC  Comparison: Maxillofacial CT dated 04/13/2012  Findings: Status post ORIF of a prior right parasymphyseal mandible fracture.  Mildly displaced fractures of the bilateral mandibular condyles.  IMPRESSION: Status post ORIF of a prior right parasymphyseal mandible fracture.  Mildly displaced fractures of the bilateral mandibular condyles.   Original Report Authenticated By: Charline Bills, M.D.    Dg Neck Soft Tissue  04/22/2012  *RADIOLOGY REPORT*  Clinical Data: Congestion, trach x1 week  NECK SOFT TISSUES - 1+ VIEW  Comparison: None.  Findings: Mild streaky lucencies overlying the lower neck corresponding to subcutaneous emphysema.  Tracheostomy.  Otherwise normal lateral neck soft tissues.  Visualized cervical spine within normal limits.  Postsurgical changes involving the mandible/maxilla.  IMPRESSION: Subcutaneous emphysema overlying the lower neck.  Tracheostomy.    Original Report Authenticated By: Charline Bills, M.D.    Dg Chest 2 View  04/22/2012  *RADIOLOGY REPORT*  Clinical Data: Chest congestion, productive cough  CHEST - 2 VIEW  Comparison: 04/15/2011  Findings: Lungs are essentially clear.  No pleural effusion or pneumothorax.  Interval development of subcutaneous emphysema in the right neck/upper chest wall.  Tracheostomy at the thoracic inlet.  Cardiomediastinal silhouette is within normal limits.  Visualized osseous structures are within normal limits.  IMPRESSION: Interval development of subcutaneous emphysema in the right neck/upper chest wall.  No pneumothorax.  Tracheostomy at the thoracic inlet.   Original Report Authenticated By: Charline Bills, M.D.    Ct Head Wo Contrast  04/13/2012  *RADIOLOGY REPORT*  Clinical Data:  Syncope with fall.  Facial lacerations.  Headache.  CT HEAD WITHOUT CONTRAST CT MAXILLOFACIAL WITHOUT CONTRAST CT CERVICAL SPINE WITHOUT CONTRAST  Technique:  Multidetector CT imaging of the head, cervical spine, and maxillofacial structures were performed using the standard protocol without intravenous contrast. Multiplanar CT image reconstructions of the cervical spine and maxillofacial structures were also generated.  Comparison:   None  CT HEAD  Findings: There is no evidence of acute intracranial hemorrhage, mass lesion, brain edema or extra-axial fluid collection.  The ventricles and subarachnoid spaces are appropriately sized for age. There is no CT evidence of acute cortical infarction.  Facial findings are described below. The calvarium is intact.  IMPRESSION: Negative head CT.  No acute intracranial or calvarial findings.  CT MAXILLOFACIAL  Findings:  There are comminuted and mildly displaced fractures of both mandibular condylar necks.  In addition, there is a nondisplaced right parasymphyseal mandibular fracture.  There is no dislocation of the mandibular condyles.  No other facial fractures are identified.  There is  some  mucosal thickening in the right frontal and anterior ethmoid air cells without air fluid levels.  There is no evidence of orbital hematoma.  IMPRESSION:  1.  Displaced fractures of both mandibular condylar necks. 2.  Nondisplaced right parasymphyseal mandibular fracture. 3.  No other facial fractures identified.  CT CERVICAL SPINE  Findings:   The cervical alignment is normal.  There is no evidence of acute cervical spine fracture or traumatic subluxation. The posterior arch of C1 is incomplete, a normal variant.  No acute soft tissue findings are seen.  There may be mild subcutaneous edema posteriorly in the neck.  IMPRESSION:  No evidence of acute cervical spine fracture, traumatic subluxation or static signs of instability.   Original Report Authenticated By: Gerrianne Scale, M.D.    Ct Cervical Spine Wo Contrast  04/13/2012  *RADIOLOGY REPORT*  Clinical Data:  Syncope with fall.  Facial lacerations.  Headache.  CT HEAD WITHOUT CONTRAST CT MAXILLOFACIAL WITHOUT CONTRAST CT CERVICAL SPINE WITHOUT CONTRAST  Technique:  Multidetector CT imaging of the head, cervical spine, and maxillofacial structures were performed using the standard protocol without intravenous contrast. Multiplanar CT image reconstructions of the cervical spine and maxillofacial structures were also generated.  Comparison:   None  CT HEAD  Findings: There is no evidence of acute intracranial hemorrhage, mass lesion, brain edema or extra-axial fluid collection.  The ventricles and subarachnoid spaces are appropriately sized for age. There is no CT evidence of acute cortical infarction.  Facial findings are described below. The calvarium is intact.  IMPRESSION: Negative head CT.  No acute intracranial or calvarial findings.  CT MAXILLOFACIAL  Findings:  There are comminuted and mildly displaced fractures of both mandibular condylar necks.  In addition, there is a nondisplaced right parasymphyseal mandibular fracture.  There is no  dislocation of the mandibular condyles.  No other facial fractures are identified.  There is some mucosal thickening in the right frontal and anterior ethmoid air cells without air fluid levels.  There is no evidence of orbital hematoma.  IMPRESSION:  1.  Displaced fractures of both mandibular condylar necks. 2.  Nondisplaced right parasymphyseal mandibular fracture. 3.  No other facial fractures identified.  CT CERVICAL SPINE  Findings:   The cervical alignment is normal.  There is no evidence of acute cervical spine fracture or traumatic subluxation. The posterior arch of C1 is incomplete, a normal variant.  No acute soft tissue findings are seen.  There may be mild subcutaneous edema posteriorly in the neck.  IMPRESSION:  No evidence of acute cervical spine fracture, traumatic subluxation or static signs of instability.   Original Report Authenticated By: Gerrianne Scale, M.D.    Dg Chest Port 1 View  04/14/2012  *RADIOLOGY REPORT*  Clinical Data: 21 year old male status post tracheostomy.  PORTABLE CHEST - 1 VIEW  Comparison: None.  Findings: Portable semi upright AP view 0453 hours.  Tracheostomy tube projects over the midline at the thoracic inlet.  Enteric tube in place, side hole at the level of the distal thoracic esophagus. Mild gaseous distention of the visualized stomach. Lung volumes at the lower limits of normal.  Cardiac size and mediastinal contours are within normal limits.  No pneumothorax or acute pulmonary opacity.  No pleural effusion.  IMPRESSION: 1.  Tracheostomy tube with no adverse features. 2.  Enteric tube in place, advanced 10 cm to place the side-hole within the stomach. 3. No acute cardiopulmonary abnormality.   Original Report Authenticated By: Harley Hallmark, M.D.  Dg Abd Portable 1v  04/14/2012  *RADIOLOGY REPORT*  Clinical Data: 21 year old male NG tube placement.  PORTABLE ABDOMEN - 1 VIEW  Comparison: Portable chest radiograph from the same time.  Findings: Portable  supine AP view 0450 hours.  NG tube tip at the level of the gastric cardia.  Recommend advancing as stated on comparison. Nonobstructed bowel gas pattern.  IMPRESSION: NG tube tip likely just within the stomach. Advance 10 cm for more optimal placement.   Original Report Authenticated By: Harley Hallmark, M.D.    Ct Maxillofacial Wo Cm  04/13/2012  *RADIOLOGY REPORT*  Clinical Data:  Syncope with fall.  Facial lacerations.  Headache.  CT HEAD WITHOUT CONTRAST CT MAXILLOFACIAL WITHOUT CONTRAST CT CERVICAL SPINE WITHOUT CONTRAST  Technique:  Multidetector CT imaging of the head, cervical spine, and maxillofacial structures were performed using the standard protocol without intravenous contrast. Multiplanar CT image reconstructions of the cervical spine and maxillofacial structures were also generated.  Comparison:   None  CT HEAD  Findings: There is no evidence of acute intracranial hemorrhage, mass lesion, brain edema or extra-axial fluid collection.  The ventricles and subarachnoid spaces are appropriately sized for age. There is no CT evidence of acute cortical infarction.  Facial findings are described below. The calvarium is intact.  IMPRESSION: Negative head CT.  No acute intracranial or calvarial findings.  CT MAXILLOFACIAL  Findings:  There are comminuted and mildly displaced fractures of both mandibular condylar necks.  In addition, there is a nondisplaced right parasymphyseal mandibular fracture.  There is no dislocation of the mandibular condyles.  No other facial fractures are identified.  There is some mucosal thickening in the right frontal and anterior ethmoid air cells without air fluid levels.  There is no evidence of orbital hematoma.  IMPRESSION:  1.  Displaced fractures of both mandibular condylar necks. 2.  Nondisplaced right parasymphyseal mandibular fracture. 3.  No other facial fractures identified.  CT CERVICAL SPINE  Findings:   The cervical alignment is normal.  There is no evidence of  acute cervical spine fracture or traumatic subluxation. The posterior arch of C1 is incomplete, a normal variant.  No acute soft tissue findings are seen.  There may be mild subcutaneous edema posteriorly in the neck.  IMPRESSION:  No evidence of acute cervical spine fracture, traumatic subluxation or static signs of instability.   Original Report Authenticated By: Gerrianne Scale, M.D.        Lyanne Co, MD 04/22/12 [phone]

## 2012-04-22 NOTE — ED Notes (Addendum)
Pt reports feeling anxious x 30 min.  Denies pain.  States he has a "weird" feeling in his chest.  Pt states he had mouth wired shut and trach inserted last week after having a syncopal episode with a mandible fracture.  Pt states he thinks he has "fluid" on R side of neck/chest. Denies sob.

## 2012-04-22 NOTE — ED Provider Notes (Signed)
History     CSN: [id number]  Arrival date & time 04/21/12  2342   First MD Initiated Contact with Patient 04/22/12 0002      Chief Complaint  Patient presents with  . Anxiety    (Consider location/radiation/quality/duration/timing/severity/associated sxs/prior treatment) Patient is a 21 y.o. male presenting with anxiety. The history is provided by the patient.  Anxiety This is a new problem. The current episode started today. Pertinent negatives include no abdominal pain, coughing, fever or nausea. Associated symptoms comments: Symptoms of chest tightness, heart pounding, skin tingling that started about one hour ago. He felt like "something really bad was going to happen". The chest tightness lasted less than 5 minutes but he reports persistent skin tingling and a feeling of intense nervousness.  He recently (one week earlier) had an episode of LOC with a fall that fractured his jaw. After his jaw was wired shut he developed tongue swelling that required an immediate tracheostomy. He reports he feels his jaw is healing and is having no difficulty with his injuries..    History reviewed. No pertinent past medical history.  Past Surgical History  Procedure Date  . Tracheostomy tube placement 04/14/2012    Procedure: TRACHEOSTOMY;  Surgeon: Melvenia Beam, MD;  Location: [facility] OR;  Service: ENT;  Laterality: N/A;  . Closed reduction mandible 04/17/2012    Procedure: CLOSED REDUCTION MANDIBULAR;  Surgeon: Melvenia Beam, MD;  Location: [facility] OR;  Service: ENT;  Laterality: N/A;  . Orif mandibular fracture 04/13/2012    Procedure: OPEN REDUCTION INTERNAL FIXATION (ORIF) MANDIBULAR FRACTURE;  Surgeon: Melvenia Beam, MD;  Location: [facility] OR;  Service: ENT;  Laterality: N/A;  . Laceration repair 04/13/2012    Procedure: REPAIR MULTIPLE LACERATIONS;  Surgeon: Melvenia Beam, MD;  Location: [facility] OR;  Service: ENT;;  Repair of chin laceration    No family history on file.  History  Substance Use Topics   . Smoking status: Current Every Day Smoker -- 0.5 packs/day    Types: Cigarettes  . Smokeless tobacco: Not on file  . Alcohol Use: Yes     once a month      Review of Systems  Constitutional: Negative for fever.  HENT:       He has had pain in the right neck for 2-3 days.  Respiratory: Positive for chest tightness and shortness of breath. Negative for cough.   Gastrointestinal: Negative for nausea and abdominal pain.  Neurological:       Skin tingling.    Allergies  Review of patient's allergies indicates no known allergies.  Home Medications  No current outpatient prescriptions on file.  BP 131/90  Pulse 105  Temp 99 F (37.2 C) (Oral)  Resp 20  SpO2 97%  Physical Exam  Constitutional: He is oriented to person, place, and time. He appears well-developed and well-nourished. No distress.  HENT:  Head: Normocephalic.       Jaw immobile. No swelling.   Neck: Normal range of motion.       Tracheostomy in place and functional. Site unremarkable. Palpable subcutaneous emphysema right lateral neck. No swelling or discoloration.  Cardiovascular: Normal rate and regular rhythm.   No murmur heard. Pulmonary/Chest: Effort normal and breath sounds normal. He has no wheezes. He has no rales. He exhibits no tenderness.  Abdominal: Soft. Bowel sounds are normal. There is no tenderness. There is no rebound and no guarding.  Musculoskeletal: Normal range of motion.  Neurological: He is alert and oriented to person, place, and time.  ED Course  Procedures (including critical care time)   Labs Reviewed  BASIC METABOLIC PANEL   No results found.   No diagnosis found.    MDM  Patient care transferred to [facility] for review of tests/x-rays and disposition.        Rodena Medin, PA-C 04/22/12 [phone]

## 2012-04-22 NOTE — ED Notes (Signed)
Patient transported to X-ray 

## 2012-04-22 NOTE — ED Notes (Signed)
Pt back from x-ray.

## 2012-05-11 ENCOUNTER — Other Ambulatory Visit (HOSPITAL_COMMUNITY): Payer: Self-pay | Admitting: Otolaryngology

## 2012-05-15 ENCOUNTER — Encounter (HOSPITAL_COMMUNITY): Payer: Self-pay | Admitting: Pharmacy Technician

## 2012-05-18 ENCOUNTER — Encounter (HOSPITAL_COMMUNITY)
Admission: RE | Admit: 2012-05-18 | Discharge: 2012-05-18 | Disposition: A | Discharge status: Home / Self Care | Payer: 59 | Source: Ambulatory Visit | Attending: Otolaryngology | Admitting: Otolaryngology

## 2012-05-18 ENCOUNTER — Encounter (HOSPITAL_COMMUNITY): Payer: Self-pay

## 2012-05-18 HISTORY — DX: Anxiety disorder, unspecified: F41.9

## 2012-05-18 LAB — SURGICAL PCR SCREEN
MRSA, PCR: NEGATIVE
Staphylococcus aureus: NEGATIVE

## 2012-05-18 LAB — BASIC METABOLIC PANEL
Chloride: 99 mEq/L (ref 96–112)
GFR calc Af Amer: >90 mL/min (ref >90)
GFR calc non Af Amer: >90 mL/min (ref >90)
Potassium: 4 mEq/L (ref 3.5–5.1)
Sodium: 139 mEq/L (ref 135–145)

## 2012-05-18 LAB — CBC
HCT: 39.6 % (ref 39.0–52.0)
Hemoglobin: 13.6 g/dL (ref 13.0–17.0)
RDW: 12.5 % (ref 11.5–15.5)
WBC: 10 10*3/uL (ref 4.0–10.5)

## 2012-05-18 NOTE — Pre-Procedure Instructions (Signed)
20 ABDIKADIR FOHL IV  05/18/2012   Your procedure is scheduled on:  05/26/2012  Report to [facility] at 6:45 AM.  Call this number if you have problems the morning of surgery: [phone]   Remember:   Do not eat food or drink liquid:After Midnight.- Thursday      Take these medicines the morning of surgery with A SIP OF WATER: Ativan & pain medicine is ok if needed    Do not wear jewelry, make-up or nail polish.  Do not wear lotions, powders, or perfumes. You may wear deodorant.  Do not shave 48 hours prior to surgery. Men may shave face and neck.  Do not bring valuables to the hospital.  Contacts, dentures or bridgework may not be worn into surgery.  Leave suitcase in the car. After surgery it may be brought to your room.  For patients admitted to the hospital, checkout time is 11:00 AM the day of discharge.   Patients discharged the day of surgery will not be allowed to drive home.  Name and phone number of your driver: /w family  Special Instructions: Shower using CHG 2 nights before surgery and the night before surgery.  If you shower the day of surgery use CHG.  Use special wash - you have one bottle of CHG for all showers.  You should use approximately 1/3 of the bottle for each shower.   Please read over the following fact sheets that you were given: Pain Booklet, Coughing and Deep Breathing, MRSA Information and Surgical Site Infection Prevention

## 2012-05-26 ENCOUNTER — Ambulatory Visit (HOSPITAL_COMMUNITY)
Admission: RE | Admit: 2012-05-26 | Discharge: 2012-05-26 | Disposition: A | Discharge status: Home / Self Care | Payer: 59 | Source: Ambulatory Visit | Attending: Otolaryngology | Admitting: Otolaryngology

## 2012-05-26 ENCOUNTER — Encounter (HOSPITAL_COMMUNITY)
Admission: RE | Disposition: A | Discharge status: Home / Self Care | Payer: Self-pay | Source: Ambulatory Visit | Attending: Otolaryngology

## 2012-05-26 ENCOUNTER — Encounter (HOSPITAL_COMMUNITY): Payer: Self-pay | Admitting: *Deleted

## 2012-05-26 ENCOUNTER — Encounter (HOSPITAL_COMMUNITY): Payer: Self-pay | Admitting: Anesthesiology

## 2012-05-26 ENCOUNTER — Ambulatory Visit (HOSPITAL_COMMUNITY): Payer: 59 | Admitting: Anesthesiology

## 2012-05-26 DIAGNOSIS — Z01812 Encounter for preprocedural laboratory examination: Secondary | ICD-10-CM | POA: Insufficient documentation

## 2012-05-26 DIAGNOSIS — F411 Generalized anxiety disorder: Secondary | ICD-10-CM | POA: Insufficient documentation

## 2012-05-26 DIAGNOSIS — Z79899 Other long term (current) drug therapy: Secondary | ICD-10-CM | POA: Insufficient documentation

## 2012-05-26 DIAGNOSIS — R259 Unspecified abnormal involuntary movements: Secondary | ICD-10-CM | POA: Insufficient documentation

## 2012-05-26 DIAGNOSIS — Z472 Encounter for removal of internal fixation device: Secondary | ICD-10-CM | POA: Insufficient documentation

## 2012-05-26 DIAGNOSIS — Z430 Encounter for attention to tracheostomy: Secondary | ICD-10-CM | POA: Insufficient documentation

## 2012-05-26 HISTORY — PX: TRACHEOSTOMY CLOSURE: SHX6132

## 2012-05-26 HISTORY — PX: MANDIBULAR HARDWARE REMOVAL: SHX5205

## 2012-05-26 SURGERY — REMOVAL, HARDWARE, MANDIBLE
Anesthesia: Monitor Anesthesia Care | Site: Neck | Wound class: Clean Contaminated

## 2012-05-26 MED ORDER — PROPOFOL INFUSION 10 MG/ML OPTIME
INTRAVENOUS | Status: DC | PRN
  Administered 2012-05-26: 75 ug/kg/min via INTRAVENOUS

## 2012-05-26 MED ORDER — FENTANYL CITRATE 0.05 MG/ML IJ SOLN
INTRAMUSCULAR | Status: AC
  Filled 2012-05-26: qty 2

## 2012-05-26 MED ORDER — MIDAZOLAM HCL 5 MG/5ML IJ SOLN
INTRAMUSCULAR | Status: DC | PRN
  Administered 2012-05-26: 2 mg via INTRAVENOUS

## 2012-05-26 MED ORDER — FENTANYL CITRATE 0.05 MG/ML IJ SOLN
25.0000 ug | INTRAMUSCULAR | Status: DC | PRN
  Administered 2012-05-26: 25 ug via INTRAVENOUS

## 2012-05-26 MED ORDER — ONDANSETRON HCL 4 MG/2ML IJ SOLN
INTRAMUSCULAR | Status: DC | PRN
  Administered 2012-05-26: 4 mg via INTRAVENOUS

## 2012-05-26 MED ORDER — OXYCODONE HCL 5 MG/5ML PO SOLN: 5.0000 mg | Freq: Once | ORAL | Status: DC | PRN

## 2012-05-26 MED ORDER — FENTANYL CITRATE 0.05 MG/ML IJ SOLN
INTRAMUSCULAR | Status: DC | PRN
  Administered 2012-05-26 (×2): 25 ug via INTRAVENOUS
  Administered 2012-05-26: 50 ug via INTRAVENOUS
  Administered 2012-05-26 (×2): 25 ug via INTRAVENOUS

## 2012-05-26 MED ORDER — OXYMETAZOLINE HCL 0.05 % NA SOLN
NASAL | Status: AC
  Filled 2012-05-26: qty 15

## 2012-05-26 MED ORDER — LIDOCAINE-EPINEPHRINE 1 %-1:100000 IJ SOLN
INTRAMUSCULAR | Status: AC
  Filled 2012-05-26: qty 1

## 2012-05-26 MED ORDER — BACITRACIN ZINC 500 UNIT/GM EX OINT
TOPICAL_OINTMENT | CUTANEOUS | Status: AC
  Filled 2012-05-26: qty 15

## 2012-05-26 MED ORDER — OXYCODONE HCL 5 MG PO TABS: 5.0000 mg | ORAL_TABLET | Freq: Once | ORAL | Status: DC | PRN

## 2012-05-26 MED ORDER — LIDOCAINE-EPINEPHRINE 1 %-1:100000 IJ SOLN
INTRAMUSCULAR | Status: DC | PRN
  Administered 2012-05-26: 10 mL

## 2012-05-26 MED ORDER — 0.9 % SODIUM CHLORIDE (POUR BTL) OPTIME
TOPICAL | Status: DC | PRN
  Administered 2012-05-26: 1000 mL

## 2012-05-26 MED ORDER — ONDANSETRON HCL 4 MG/2ML IJ SOLN: 4.0000 mg | Freq: Four times a day (QID) | INTRAMUSCULAR | Status: DC | PRN

## 2012-05-26 MED ORDER — LACTATED RINGERS IV SOLN
INTRAVENOUS | Status: DC | PRN
  Administered 2012-05-26: 09:00:00 via INTRAVENOUS

## 2012-05-26 SURGICAL SUPPLY — 32 items
BLADE SURG 15 STRL LF DISP TIS (BLADE) IMPLANT
BLADE SURG 15 STRL SS (BLADE)
BLADE SURG ROTATE 9660 (MISCELLANEOUS) IMPLANT
CANISTER SUCTION 2500CC (MISCELLANEOUS) ×3 IMPLANT
CLEANER TIP ELECTROSURG 2X2 (MISCELLANEOUS) IMPLANT
CLOTH BEACON ORANGE TIMEOUT ST (SAFETY) ×3 IMPLANT
COVER SURGICAL LIGHT HANDLE (MISCELLANEOUS) ×3 IMPLANT
DECANTER SPIKE VIAL GLASS SM (MISCELLANEOUS) IMPLANT
DRSG NASOPORE 8CM (GAUZE/BANDAGES/DRESSINGS) IMPLANT
ELECT COATED BLADE 2.86 ST (ELECTRODE) IMPLANT
ELECT REM PT RETURN 9FT ADLT (ELECTROSURGICAL)
ELECTRODE REM PT RTRN 9FT ADLT (ELECTROSURGICAL) IMPLANT
GAUZE VASELINE 3X9 (GAUZE/BANDAGES/DRESSINGS) ×1 IMPLANT
GLOVE SURG SS PI 7.5 STRL IVOR (GLOVE) ×3 IMPLANT
GOWN STRL NON-REIN LRG LVL3 (GOWN DISPOSABLE) ×6 IMPLANT
KIT BASIN OR (CUSTOM PROCEDURE TRAY) ×3 IMPLANT
KIT ROOM TURNOVER OR (KITS) ×3 IMPLANT
NEEDLE 27GAX1X1/2 (NEEDLE) IMPLANT
NS IRRIG 1000ML POUR BTL (IV SOLUTION) ×3 IMPLANT
PAD ARMBOARD 7.5X6 YLW CONV (MISCELLANEOUS) ×6 IMPLANT
PENCIL FOOT CONTROL (ELECTRODE) IMPLANT
SCISSORS WIRE ANG 4 3/4 DISP (INSTRUMENTS) IMPLANT
SPONGE GAUZE 4X4 12PLY (GAUZE/BANDAGES/DRESSINGS) ×1 IMPLANT
SUT ETHILON 4 0 CL P 3 (SUTURE) IMPLANT
SUT MON AB 3-0 SH 27 (SUTURE)
SUT MON AB 3-0 SH27 (SUTURE) IMPLANT
SUT PROLENE 6 0 PC 1 (SUTURE) IMPLANT
SUT VICRYL 4-0 PS2 18IN ABS (SUTURE) IMPLANT
TOWEL OR 17X24 6PK STRL BLUE (TOWEL DISPOSABLE) IMPLANT
TOWEL OR 17X26 10 PK STRL BLUE (TOWEL DISPOSABLE) ×3 IMPLANT
TRAY ENT MC OR (CUSTOM PROCEDURE TRAY) ×3 IMPLANT
WATER STERILE IRR 1000ML POUR (IV SOLUTION) ×3 IMPLANT

## 2012-05-26 NOTE — Op Note (Signed)
DATE OF OPERATION: 05/26/2012 Surgeon: Melvenia Beam Procedure Performed: 13086 removal of maxillomandibular fixation hardware and removal of tracheostomy tube PREOPERATIVE DIAGNOSIS: mandible fracture s/p repair and tracheotomy POSTOPERATIVE DIAGNOSIS: mandible fracture s/p repair and tracheotomy SURGEON: Melvenia Beam ANESTHESIA: conscious sedation/local ESTIMATED BLOOD LOSS: less than 2 mL.  DRAINS: none SPECIMENS: 8 screws and 4 wires and 6 cuffless trach with passy muir valve removed INDICATIONS: The patient is a 20yo with a history of mandible fracture s/p repair and tracheotomy DESCRIPTION OF OPERATION: The patient was brought to the operating room and was placed in the supine position and placed under conscious sedation by anesthesiology. I injected the gingival mucosa with 1% lidocaine with epinephrine bilaterally. I then placed a lip retractor. I cut and removed all four MMF wires. I then used the screwdriver to remove the 4 mandibular 12mm screws and the 4 maxillary 8mm screws without difficulty. I held pressure on the screw sites for hemostasis. I then removed his 6 cuffless trach and dressed the trach site with a Vaseline guaze and a 4x4 gauze and silk tape. He had some mild/moderate expected trismus but the tongue was normal and he was in excellent class I occlusion. He was breathing and phonating well with the trach out before leaving the OR. The patient was turned back to anesthesia and awakened from anesthesia  without difficulty. The patient tolerated the procedure well with no immediate complications and was taken to the postoperative recovery area in good condition.   Dr. Melvenia Beam was present and performed the entire procedure. 05/26/2012 9:17 AM Melvenia Beam

## 2012-05-26 NOTE — OR Nursing (Signed)
8 Screws and retained wires explanted from mandible by Dr. Emeline Darling and discarded.

## 2012-05-26 NOTE — H&P (Signed)
05/26/2012 7:09 AM  Melvenia Beam  PREOPERATIVE HISTORY AND PHYSICAL  CHIEF COMPLAINT: s/p repair of mandible fracture and tracheotomy, here for removal of MMF hardware and decannulation of trach  HISTORY: This is a 21 year old who is ~5-6 weeks s/p repair of a parasymphyseal and bilateral subcondylar mandible fracture with 8-post MMF and a plate to the right parasymphyseal mandible. His post-op course was complicated by vomiting and tongue swelling requiring a trach and MMF problems requiring revision of his MMF. His mandible fractures are healed so he now presents for removal of his MMF screws and wires and decannulation of his tracheotomy tube.  Dr. Emeline Darling, [name] has discussed the risks (airway problems, bleeding, nonunion, malunion, wound problems, etc.), benefits, and alternatives of this procedure. The patient understands the risks and would like to proceed with the procedure. The chances of success of the procedure are >50% and the patient understands this. I personally performed an examination of the patient within 24 hours of the procedure.  PAST MEDICAL HISTORY: Past Medical History  Diagnosis Date  . Anxiety     pt. 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    PAST SURGICAL HISTORY: Past Surgical History  Procedure Date  . Tracheostomy tube placement 04/14/2012    Procedure: TRACHEOSTOMY;  Surgeon: Melvenia Beam, MD;  Location: [facility] OR;  Service: ENT;  Laterality: N/A;  . Closed reduction mandible 04/17/2012    Procedure: CLOSED REDUCTION MANDIBULAR;  Surgeon: Melvenia Beam, MD;  Location: [facility] OR;  Service: ENT;  Laterality: N/A;  . Orif mandibular fracture 04/13/2012    Procedure: OPEN REDUCTION INTERNAL FIXATION (ORIF) MANDIBULAR FRACTURE;  Surgeon: Melvenia Beam, MD;  Location: [facility] OR;  Service: ENT;  Laterality: N/A;  . Laceration repair 04/13/2012    Procedure: REPAIR MULTIPLE LACERATIONS;  Surgeon:  Melvenia Beam, MD;  Location: [facility] OR;  Service: ENT;;  Repair of chin laceration    MEDICATIONS: No current facility-administered medications on file prior to encounter.   Current Outpatient Prescriptions on File Prior to Encounter  Medication Sig Dispense Refill  . promethazine (PHENERGAN) 6.25 MG/5ML syrup Take 12.5 mg by mouth 4 (four) times daily as needed. For nausea and vomiting      .   ALLERGIES: No Known Allergies  SOCIAL HISTORY: History   Social History  . Marital Status: Single    Spouse Name: N/A    Number of Children: N/A  . Years of Education: N/A   Occupational History  . Not on file.   Social History Main Topics  . Smoking status: Former Smoker -- 0.5 packs/day    Types: Cigarettes    Quit date: 04/13/2012  . Smokeless tobacco: Not on file  . Alcohol Use: Yes     Comment: once a month- beer, rare hard liquor  . Drug Use: No  . Sexually Active:    Other Topics Concern  . Not on file   Social History Narrative  . No narrative on file    FAMILY HISTORY:History reviewed. No pertinent family history.  REVIEW OF SYSTEMS:  HEENT:negative x 10 systems except per HPI   PHYSICAL EXAM:  GENERAL:  NAD VITAL SIGNS:   Filed Vitals:   05/26/12 0656  BP: 117/80  Pulse: 87  Temp: 98.2 F (36.8 C)  Resp: 20   SKIN:  Warm, dry HEENT:  Class I maxillomandibular occlusion with 8 post MMF intact. Intraoral and chin wounds well-healed.  NECK:  6 cuffless trach in  place and secure LYMPH:  No LAD LUNGS:  grossly clear CARDIOVASCULAR:  RRR ABDOMEN:  Soft, NT MUSCULOSKELETAL: normal strength PSYCH:  Normal affect NEUROLOGIC:  CN 2-12 grossly intact and symmetric   ASSESSMENT AND PLAN: Plan to proceed with removal of maxillomandibular hardware and decannulation. Patient understands the risks, benefits, and alternatives.  05/26/2012 7:09 AM Melvenia Beam

## 2012-05-26 NOTE — Transfer of Care (Signed)
Immediate Anesthesia Transfer of Care Note  Patient: Gabriel Nguyen  Procedure(s) Performed: Procedure(s) (LRB) with comments: MANDIBULAR HARDWARE REMOVAL (N/A) TRACHEOSTOMY CLOSURE (N/A)  Patient Location: PACU  Anesthesia Type:MAC  Level of Consciousness: awake, alert  and oriented  Airway & Oxygen Therapy: Patient Spontanous Breathing and Patient connected to nasal cannula oxygen  Post-op Assessment: Report given to PACU RN, Post -op Vital signs reviewed and stable and Patient moving all extremities  Post vital signs: Reviewed and stable  Complications: No apparent anesthesia complications

## 2012-05-26 NOTE — Anesthesia Postprocedure Evaluation (Signed)
Anesthesia Post Note  Patient: Gabriel Nguyen  Procedure(s) Performed: Procedure(s) (LRB): MANDIBULAR HARDWARE REMOVAL (N/A) TRACHEOSTOMY CLOSURE (N/A)  Anesthesia type: MAC  Patient location: PACU  Post pain: Pain level controlled and Adequate analgesia  Post assessment: Post-op Vital signs reviewed, Patient's Cardiovascular Status Stable and Respiratory Function Stable  Last Vitals:  Filed Vitals:   05/26/12 1035  BP: 118/84  Pulse: 85  Temp: 36.8 C  Resp: 20    Post vital signs: Reviewed and stable  Level of consciousness: awake, alert  and oriented  Complications: No apparent anesthesia complications

## 2012-05-26 NOTE — Anesthesia Preprocedure Evaluation (Signed)
Anesthesia Evaluation  Patient identified by MRN, date of birth, ID band Patient awake    Reviewed: Allergy & Precautions, H&P , NPO status , Patient's Chart, lab work & pertinent test results  Airway  TM Distance: >3 FB Neck ROM: full  Mouth opening: Limited Mouth Opening Comment: Mouth wired shut.  Tracheostomy tube in place. Dental   Pulmonary          Cardiovascular     Neuro/Psych Anxiety    GI/Hepatic   Endo/Other    Renal/GU      Musculoskeletal   Abdominal   Peds  Hematology   Anesthesia Other Findings   Reproductive/Obstetrics                           Anesthesia Physical Anesthesia Plan  ASA: II  Anesthesia Plan: MAC   Post-op Pain Management:    Induction: Intravenous  Airway Management Planned: Nasal Cannula  Additional Equipment:   Intra-op Plan:   Post-operative Plan:   Informed Consent: I have reviewed the patients History and Physical, chart, labs and discussed the procedure including the risks, benefits and alternatives for the proposed anesthesia with the patient or authorized representative who has indicated his/her understanding and acceptance.     Plan Discussed with: CRNA and Surgeon  Anesthesia Plan Comments:         Anesthesia Quick Evaluation

## 2012-05-29 ENCOUNTER — Encounter (HOSPITAL_COMMUNITY): Payer: Self-pay | Admitting: Otolaryngology

## 2012-05-30 ENCOUNTER — Encounter (HOSPITAL_COMMUNITY): Payer: Self-pay

## 2012-11-29 ENCOUNTER — Emergency Department (HOSPITAL_COMMUNITY): Payer: 59

## 2012-11-29 ENCOUNTER — Encounter (HOSPITAL_COMMUNITY): Payer: Self-pay | Admitting: Physical Medicine and Rehabilitation

## 2012-11-29 ENCOUNTER — Emergency Department (INDEPENDENT_AMBULATORY_CARE_PROVIDER_SITE_OTHER)
Admission: EM | Admit: 2012-11-29 | Discharge: 2012-11-29 | Disposition: A | Discharge status: Other Acute Inpatient Hospital | Payer: 59 | Source: Home / Self Care

## 2012-11-29 ENCOUNTER — Encounter (HOSPITAL_COMMUNITY): Payer: Self-pay | Admitting: *Deleted

## 2012-11-29 ENCOUNTER — Emergency Department (HOSPITAL_COMMUNITY)
Admission: EM | Admit: 2012-11-29 | Discharge: 2012-11-29 | Disposition: A | Discharge status: Home / Self Care | Payer: 59 | Attending: Emergency Medicine | Admitting: Emergency Medicine

## 2012-11-29 DIAGNOSIS — R079 Chest pain, unspecified: Secondary | ICD-10-CM | POA: Insufficient documentation

## 2012-11-29 DIAGNOSIS — Z79899 Other long term (current) drug therapy: Secondary | ICD-10-CM | POA: Insufficient documentation

## 2012-11-29 DIAGNOSIS — Z930 Tracheostomy status: Secondary | ICD-10-CM | POA: Insufficient documentation

## 2012-11-29 DIAGNOSIS — Z87891 Personal history of nicotine dependence: Secondary | ICD-10-CM | POA: Insufficient documentation

## 2012-11-29 DIAGNOSIS — F411 Generalized anxiety disorder: Secondary | ICD-10-CM | POA: Insufficient documentation

## 2012-11-29 DIAGNOSIS — R209 Unspecified disturbances of skin sensation: Secondary | ICD-10-CM | POA: Insufficient documentation

## 2012-11-29 LAB — CBC WITH DIFFERENTIAL/PLATELET
Lymphocytes Relative: 24 % (ref 12–46)
Lymphs Abs: 2.1 10*3/uL (ref 0.7–4.0)
MCV: 89.1 fL (ref 78.0–100.0)
Neutrophils Relative %: 68 % (ref 43–77)
Platelets: 292 10*3/uL (ref 150–400)
RBC: 4.58 MIL/uL (ref 4.22–5.81)
WBC: 8.6 10*3/uL (ref 4.0–10.5)

## 2012-11-29 LAB — BASIC METABOLIC PANEL
CO2: 28 mEq/L (ref 19–32)
Glucose, Bld: 66 mg/dL — ABNORMAL LOW (ref 70–99)
Potassium: 3.7 mEq/L (ref 3.5–5.1)
Sodium: 140 mEq/L (ref 135–145)

## 2012-11-29 LAB — POCT I-STAT TROPONIN I

## 2012-11-29 NOTE — ED Provider Notes (Signed)
History     CSN: [id number]  Arrival date & time 11/29/12  1024   None     Chief Complaint  Patient presents with  . Chest Pain    (Consider location/radiation/quality/duration/timing/severity/associated sxs/prior treatment) HPI Comments: This is a 22 year old male with a history of frequent, episodic chest pain located to the upper sternal borders, left chest and left lateral chest. Is associated with a tingling sensation in the left arm especially with driving. There are 2 characteristics of his chest pain. One is the heaviness, tightness and throbbing in the upper mid and left anterior chest the other is more of a sharp femoral type pain in the lateral chest wall at the midaxillary line. It is worse with raising the arm and driving. He denies associated shortness of breath, diaphoresis, GI symptoms or absence of energy level. He and his mother particularly concerned but calls his father had an MI at 31 and another close relative had a similar event a young age. He is currently having intermittent chest pains of both types during examination.   Past Medical History  Diagnosis Date  . Anxiety     pt. 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    Past Surgical History  Procedure Laterality Date  . Tracheostomy tube placement  04/14/2012    Procedure: TRACHEOSTOMY;  Surgeon: Melvenia Beam, MD;  Location: [facility] OR;  Service: ENT;  Laterality: N/A;  . Closed reduction mandible  04/17/2012    Procedure: CLOSED REDUCTION MANDIBULAR;  Surgeon: Melvenia Beam, MD;  Location: [facility] OR;  Service: ENT;  Laterality: N/A;  . Orif mandibular fracture  04/13/2012    Procedure: OPEN REDUCTION INTERNAL FIXATION (ORIF) MANDIBULAR FRACTURE;  Surgeon: Melvenia Beam, MD;  Location: [facility] OR;  Service: ENT;  Laterality: N/A;  . Laceration repair  04/13/2012    Procedure: REPAIR MULTIPLE LACERATIONS;  Surgeon: Melvenia Beam, MD;  Location: [facility] OR;   Service: ENT;;  Repair of chin laceration  . Mandibular hardware removal  05/26/2012    Procedure: MANDIBULAR HARDWARE REMOVAL;  Surgeon: Melvenia Beam, MD;  Location: [facility] OR;  Service: ENT;  Laterality: N/A;  . Tracheostomy closure  05/26/2012    Procedure: TRACHEOSTOMY CLOSURE;  Surgeon: Melvenia Beam, MD;  Location: [facility] OR;  Service: ENT;  Laterality: N/A;    History reviewed. No pertinent family history.  History  Substance Use Topics  . Smoking status: Former Smoker -- 0.50 packs/day    Types: Cigarettes    Quit date: 04/13/2012  . Smokeless tobacco: Not on file  . Alcohol Use: Yes     Comment: once a month- beer, rare hard liquor      Review of Systems  Unable to perform ROS Constitutional: Negative for fever, diaphoresis, activity change and fatigue.  HENT: Negative.   Respiratory: Positive for choking. Negative for cough, shortness of breath and wheezing.   Cardiovascular: Positive for chest pain. Negative for leg swelling.  Gastrointestinal: Negative.   Genitourinary: Negative.   Musculoskeletal: Negative.   Skin: Negative.   Neurological: Positive for numbness. Negative for dizziness, syncope and facial asymmetry.  Psychiatric/Behavioral: The patient is nervous/anxious and is hyperactive.   All other systems reviewed and are negative.    Allergies  Review of patient's allergies indicates no known allergies.  Home Medications   Current Outpatient Rx  Name  Route  Sig  Dispense  Refill  . HYDROcodone-acetaminophen (LORTAB) 7.5-500 MG per tablet   Oral  Take 1 tablet by mouth every 6 (six) hours as needed. For pain         . LORazepam (ATIVAN) 2 MG/ML concentrated solution   Oral   Take 0.5 mg by mouth every 8 (eight) hours. For anxiety         . promethazine (PHENERGAN) 6.25 MG/5ML syrup   Oral   Take 12.5 mg by mouth 4 (four) times daily as needed. For nausea and vomiting           BP 122/81  Pulse 105  Temp(Src) 98.1 F (36.7 C) (Oral)   SpO2 100%  Physical Exam  Nursing note and vitals reviewed. Constitutional: He is oriented to person, place, and time. He appears well-developed and well-nourished. No distress.  Eyes: Conjunctivae and EOM are normal.  Neck: Normal range of motion. Neck supple.  Cardiovascular: Normal rate, regular rhythm and normal heart sounds.  Exam reveals no gallop.   No murmur heard. Pulmonary/Chest: Effort normal and breath sounds normal. No respiratory distress. He has no wheezes. He has no rales.  Abdominal: Soft. There is no tenderness.  Musculoskeletal: He exhibits no edema and no tenderness.  The patient nor I are able to reproduce the pain with manual pressure to the chest or arm movements, chest muscle contractions or other musculoskeletal maneuvers.  Lymphadenopathy:    He has no cervical adenopathy.  Neurological: He is alert and oriented to person, place, and time. He exhibits normal muscle tone.  Skin: Skin is warm and dry. No erythema.  Psychiatric: He has a normal mood and affect.    ED Course  Procedures (including critical care time)  Labs Reviewed - No data to display No results found.   1. Chest pain       MDM  Transfer to the ED, [facility] for further evaluation of his chest pain described as heaviness and pressure. Family hx of MI in father age 73. And another close relative. EKG NSR with early  Repol. As he has been having the chest pain intermittently for at least 2 weeks and is relatively stable will transport via shuttle.         Hayden Rasmussen, NP 11/29/12 1137

## 2012-11-29 NOTE — ED Notes (Signed)
Pt presents to department from [facility] for evaluation of midsternal chest pain and numbness/tingling to L arm. Symptoms ongoing x2 weeks. Respirations unlabored. Pt is conscious alert and oriented x4. Denies pain at the time.

## 2012-11-29 NOTE — ED Notes (Signed)
Pt states he has intermittent cp x1 mo. Pt states "it's really sporadic throughout the day, I'll feel it like 4-5 times a day." Currently denies CP. Denies sob, dizziness, lightheadedness, diaphoresis, n/v.

## 2012-11-29 NOTE — ED Provider Notes (Signed)
History     CSN: [id number]  Arrival date & time 11/29/12  1144   First MD Initiated Contact with Patient 11/29/12 1239      Chief Complaint  Patient presents with  . Chest Pain  . Numbness    (Consider location/radiation/quality/duration/timing/severity/associated sxs/prior treatment) The history is provided by the patient.  Lawson Radar IV is a 22 y.o. male here with chest pain. Intermittent left-sided chest pain worse with driving. Sometimes radiates to her left arm with movement. He said as a sense of heaviness. Denies shortness of breath or nausea or vomiting or diaphoresis. He said it's been going on for several months but  Becomes more frequent in the last week. He works at [employer] and does a lot of physical work. He had an emergent trach a year ago during jaw surgery. [facility] site has closed and he has no trouble breathing. Sent from urgent care for eval. No hx of DVT/PE. Dather has MI at 61s.    Past Medical History  Diagnosis Date  . Anxiety     pt. 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    Past Surgical History  Procedure Laterality Date  . Tracheostomy tube placement  04/14/2012    Procedure: TRACHEOSTOMY;  Surgeon: Melvenia Beam, MD;  Location: [facility] OR;  Service: ENT;  Laterality: N/A;  . Closed reduction mandible  04/17/2012    Procedure: CLOSED REDUCTION MANDIBULAR;  Surgeon: Melvenia Beam, MD;  Location: [facility] OR;  Service: ENT;  Laterality: N/A;  . Orif mandibular fracture  04/13/2012    Procedure: OPEN REDUCTION INTERNAL FIXATION (ORIF) MANDIBULAR FRACTURE;  Surgeon: Melvenia Beam, MD;  Location: [facility] OR;  Service: ENT;  Laterality: N/A;  . Laceration repair  04/13/2012    Procedure: REPAIR MULTIPLE LACERATIONS;  Surgeon: Melvenia Beam, MD;  Location: [facility] OR;  Service: ENT;;  Repair of chin laceration  . Mandibular hardware removal  05/26/2012    Procedure: MANDIBULAR HARDWARE REMOVAL;  Surgeon:  Melvenia Beam, MD;  Location: [facility] OR;  Service: ENT;  Laterality: N/A;  . Tracheostomy closure  05/26/2012    Procedure: TRACHEOSTOMY CLOSURE;  Surgeon: Melvenia Beam, MD;  Location: [facility] OR;  Service: ENT;  Laterality: N/A;    History reviewed. No pertinent family history.  History  Substance Use Topics  . Smoking status: Former Smoker -- 0.50 packs/day    Types: Cigarettes    Quit date: 04/13/2012  . Smokeless tobacco: Not on file  . Alcohol Use: Yes     Comment: once a month- beer, rare hard liquor      Review of Systems  Cardiovascular: Positive for chest pain.  All other systems reviewed and are negative.    Allergies  Review of patient's allergies indicates no known allergies.  Home Medications   Current Outpatient Rx  Name  Route  Sig  Dispense  Refill  . LORazepam (ATIVAN) 2 MG/ML concentrated solution   Oral   Take 0.5 mg by mouth every 8 (eight) hours. For anxiety           BP 115/73  Pulse 62  Temp(Src) 97.9 F (36.6 C) (Oral)  Resp 18  SpO2 99%  Physical Exam  Nursing note and vitals reviewed. Constitutional: He is oriented to person, place, and time. He appears well-developed and well-nourished.  Calm, NAD   HENT:  Head: Normocephalic.  Mouth/Throat: Oropharynx is clear and moist.  Eyes: Conjunctivae are normal. Pupils are equal, round,  and reactive to light.  Neck: Normal range of motion. Neck supple.  Cardiovascular: Normal rate, regular rhythm and normal heart sounds.   Pulmonary/Chest: Effort normal and breath sounds normal. No respiratory distress. He has no wheezes. He has no rales.  Abdominal: Soft. Bowel sounds are normal. He exhibits no distension. There is no tenderness. There is no rebound and no guarding.  Musculoskeletal: Normal range of motion. He exhibits no edema and no tenderness.  Neurological: He is alert and oriented to person, place, and time.  Skin: Skin is warm and dry.  Psychiatric: He has a normal mood and affect. His  behavior is normal. Judgment and thought content normal.    ED Course  Procedures (including critical care time)  Labs Reviewed  BASIC METABOLIC PANEL - Abnormal; Notable for the following:    Glucose, Bld 66 (*)    All other components within normal limits  CBC WITH DIFFERENTIAL  POCT I-STAT TROPONIN I   Dg Chest 2 View  11/29/2012   *RADIOLOGY REPORT*  Clinical Data: Chest pain, numbness left arm  CHEST - 2 VIEW  Comparison: 04/22/2012  Findings: Tracheostomy tube no longer identified. Normal heart size, mediastinal contours, and pulmonary vascularity. Lungs slightly hyperexpanded chronically but clear. No infiltrate, pleural effusion or pneumothorax. No acute osseous findings.  IMPRESSION: No acute abnormalities.   Original Report Authenticated By: Ulyses Southward, M.D.     No diagnosis found.   Date: 11/29/2012  Rate: 66  Rhythm: normal sinus rhythm  QRS Axis: normal  Intervals: normal  ST/T Wave abnormalities: J point elevation, unchanged   Conduction Disutrbances:none  Narrative Interpretation:   Old EKG Reviewed: unchanged    MDM  Lawson Radar IV is a 22 y.o. male here with atypical chest pain for months. Trop neg x 1 and he is low risk for ACS. Not concerned for PE or dissection. Will get CXR. Doesn't want pain meds.   2:34 PM CXR unremarkable. Pain free. Will d/c home. Likely MSK. Recommend prn motrin and light duty. Return precautions given.          Richardean Canal, MD 11/29/12 1435

## 2012-11-29 NOTE — ED Notes (Signed)
Pt       Reports      l sided  Chest  Pain  intermittantly      X  2   Weeks  Associated  With  l  Arm    intermittantly     Numb  X  sev  Months   -  Skin is  Warm and  Dry     Pt is  Alert oriented   Somewhat    Rapid  Speech      Lungs  Are  Clear

## 2012-11-30 NOTE — ED Provider Notes (Signed)
Medical screening examination/treatment/procedure(s) were performed by non-physician practitioner and as supervising physician I was immediately available for consultation/collaboration.   [facility]; MD  Sharin Grave, MD 11/30/12 [phone]

## 2012-12-07 ENCOUNTER — Emergency Department (HOSPITAL_COMMUNITY)
Admission: EM | Admit: 2012-12-07 | Discharge: 2012-12-07 | Disposition: A | Discharge status: Home / Self Care | Payer: 59 | Source: Home / Self Care | Attending: Emergency Medicine | Admitting: Emergency Medicine

## 2012-12-07 ENCOUNTER — Encounter (HOSPITAL_COMMUNITY): Payer: Self-pay | Admitting: Emergency Medicine

## 2012-12-07 DIAGNOSIS — R0789 Other chest pain: Secondary | ICD-10-CM

## 2012-12-07 NOTE — ED Provider Notes (Signed)
Chief Complaint:   Chief Complaint  Patient presents with  . Follow-up    History of Present Illness:   Gabriel Nguyen is a 22 year old male who was seen here on may the 28th because of substernal chest pain. This had actually been going on for about 2-3 weeks previously. He was transferred from here to the hospital where he had a chest x-ray, EKG, and troponin, all of which were negative. This was thought to be musculoskeletal and he was discharged on symptomatic treatment. He was not given any specific return to work date. Ever since then the pain has gone away completely. He returns today for a recheck and to be released to return to work. He denies any shortness of breath, palpitations, dizziness, or syncope. His only other issue has been numbness of the left arm. This has been going on about a month and a half, and has now gone away completely as well. The patient was hospitalized in October of last year because of a jaw fracture. He had some type of reaction with swelling of his tongue and had had a tracheostomy done.  Review of Systems:  Other than noted above, the patient denies any of the following symptoms. Systemic:  No fever, chills, sweats, or fatigue. ENT:  No nasal congestion, rhinorrhea, or sore throat. Pulmonary:  No cough, wheezing, shortness of breath, sputum production, hemoptysis. Cardiac:  No palpitations, rapid heartbeat, dizziness, presyncope or syncope. GI:  No abdominal pain, heartburn, nausea, or vomiting. Ext:  No leg pain or swelling.  PMFSH:  Past medical history, family history, social history, meds, and allergies were reviewed and updated as needed.   Physical Exam:   Vital signs:  BP 135/91  Pulse 82  Temp(Src) 98 F (36.7 C) (Oral)  Resp 16  SpO2 100% Gen:  Alert, oriented, in no distress, skin warm and dry. Eye:  PERRL, lids and conjunctivas normal.  Sclera non-icteric. ENT:  Mucous membranes moist, pharynx clear. Neck:  Supple, no adenopathy or  tenderness.  No JVD. Lungs:  Clear to auscultation, no wheezes, rales or rhonchi.  No respiratory distress. Heart:  Regular rhythm.  No gallops, murmers, clicks or rubs. Chest:  No chest wall tenderness. Abdomen:  Soft, nontender, no organomegaly or mass.  Bowel sounds normal.  No pulsatile abdominal mass or bruit. Ext:  No edema.  No calf tenderness and Homann's sign negative.  Pulses full and equal. Skin:  Warm and dry.  No rash.  Assessment:  The encounter diagnosis was Musculoskeletal chest pain.  The pain appears to be musculoskeletal. No evidence of cardiac pain. His symptoms have completely resolved, and he may return to work without any restrictions.   Plan:   1.  The following meds were prescribed:   Discharge Medication List as of 12/07/2012  4:26 PM     2.  The patient was instructed in symptomatic care and handouts were given. 3.  The patient was told to return if becoming worse in any way, if no better in 3 or 4 days, and given some red flag symptoms such as any recurrence of chest pain that would indicate earlier return. 4.  Follow up here if any further problems.    Reuben Likes, MD 12/07/12 2232

## 2012-12-07 NOTE — ED Notes (Signed)
Follow up to 5/28 appt.  Patient reports she is feeling much better.  Patient is smiling and very comfortable.  Reports he is steadily improving

## 2013-02-21 ENCOUNTER — Emergency Department (HOSPITAL_COMMUNITY)
Admission: EM | Admit: 2013-02-21 | Discharge: 2013-02-21 | Disposition: A | Discharge status: Home / Self Care | Payer: 59 | Source: Home / Self Care

## 2013-02-22 ENCOUNTER — Emergency Department (HOSPITAL_COMMUNITY)
Admission: EM | Admit: 2013-02-22 | Discharge: 2013-02-22 | Disposition: A | Discharge status: Home / Self Care | Payer: 59 | Attending: Emergency Medicine | Admitting: Emergency Medicine

## 2013-02-22 ENCOUNTER — Encounter (HOSPITAL_COMMUNITY): Payer: Self-pay | Admitting: *Deleted

## 2013-02-22 DIAGNOSIS — Z8659 Personal history of other mental and behavioral disorders: Secondary | ICD-10-CM | POA: Insufficient documentation

## 2013-02-22 DIAGNOSIS — Z87891 Personal history of nicotine dependence: Secondary | ICD-10-CM | POA: Insufficient documentation

## 2013-02-22 DIAGNOSIS — N4889 Other specified disorders of penis: Secondary | ICD-10-CM

## 2013-02-22 DIAGNOSIS — R209 Unspecified disturbances of skin sensation: Secondary | ICD-10-CM | POA: Insufficient documentation

## 2013-02-22 LAB — GC/CHLAMYDIA PROBE AMP: CT Probe RNA: NEGATIVE

## 2013-02-22 NOTE — ED Notes (Signed)
Pt c/o numbness, pain, tingling in penis and groin area. Pt states he has been occuring off and on for the past 2-3 weeks. As of last night numbness became worse.

## 2013-02-22 NOTE — ED Provider Notes (Signed)
CSN: [id number]     Arrival date & time 02/22/13  0030 History     First MD Initiated Contact with Patient 02/22/13 0124     Chief Complaint  Patient presents with  . Groin Swelling  . Numbness   (Consider location/radiation/quality/duration/timing/severity/associated sxs/prior Treatment) HPI History provided by pt.   Pt has had numbness in the shaft of his penis since last night.  He can feel it when he touches, but it feels like it is detached.  No associated fever, abd/low back pain, pain in penis, urethral discharge, dysuria, incontinence, bowel retention, LE paresthesias.  His girlfriend is present and she is asymptomatic.  Pt denies trauma.  He has sex 3-4 times a day, on a daily basis.  Past Medical History  Diagnosis Date  . Anxiety     pt. 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   Past Surgical History  Procedure Laterality Date  . Tracheostomy tube placement  04/14/2012    Procedure: TRACHEOSTOMY;  Surgeon: Melvenia Beam, MD;  Location: [facility] OR;  Service: ENT;  Laterality: N/A;  . Closed reduction mandible  04/17/2012    Procedure: CLOSED REDUCTION MANDIBULAR;  Surgeon: Melvenia Beam, MD;  Location: [facility] OR;  Service: ENT;  Laterality: N/A;  . Orif mandibular fracture  04/13/2012    Procedure: OPEN REDUCTION INTERNAL FIXATION (ORIF) MANDIBULAR FRACTURE;  Surgeon: Melvenia Beam, MD;  Location: [facility] OR;  Service: ENT;  Laterality: N/A;  . Laceration repair  04/13/2012    Procedure: REPAIR MULTIPLE LACERATIONS;  Surgeon: Melvenia Beam, MD;  Location: [facility] OR;  Service: ENT;;  Repair of chin laceration  . Mandibular hardware removal  05/26/2012    Procedure: MANDIBULAR HARDWARE REMOVAL;  Surgeon: Melvenia Beam, MD;  Location: [facility] OR;  Service: ENT;  Laterality: N/A;  . Tracheostomy closure  05/26/2012    Procedure: TRACHEOSTOMY CLOSURE;  Surgeon: Melvenia Beam, MD;  Location: [facility] OR;  Service: ENT;  Laterality: N/A;    History reviewed. No pertinent family history. History  Substance Use Topics  . Smoking status: Former Smoker -- 0.50 packs/day    Types: Cigarettes    Quit date: 04/13/2012  . Smokeless tobacco: Not on file  . Alcohol Use: Yes     Comment: once a month- beer, rare hard liquor    Review of Systems  All other systems reviewed and are negative.    Allergies  Review of patient's allergies indicates no known allergies.  Home Medications  No current outpatient prescriptions on file. BP 140/80  Pulse 80  Temp(Src) 98.9 F (37.2 C) (Oral)  Resp 18  Ht 6\' 4"  (1.93 m)  Wt 171 lb (77.565 kg)  BMI 20.82 kg/m2  SpO2 97% Physical Exam  Nursing note and vitals reviewed. Constitutional: He is oriented to person, place, and time. He appears well-developed and well-nourished. No distress.  HENT:  Head: Normocephalic and atraumatic.  Eyes:  Normal appearance  Neck: Normal range of motion.  Cardiovascular: Normal rate and regular rhythm.   Pulmonary/Chest: Effort normal and breath sounds normal. No respiratory distress.  Abdominal: Soft. Bowel sounds are normal. He exhibits no distension and no mass. There is no tenderness. There is no rebound and no guarding.  Genitourinary:  No genitalia rash.  Nml sensation in shaft of penis.  No penile discharge.  Testicles descended bilaterally.  No masses.  No tenderness.     Musculoskeletal: Normal range of motion.  Entire low back non-tender  Neurological: He is alert and oriented to person, place, and time.  Skin: Skin is warm and dry. No rash noted.  Psychiatric: He has a normal mood and affect. His behavior is normal.    ED Course   Procedures (including critical care time)  Labs Reviewed - No data to display No results found. 1. Penis pain     MDM  22yo M presents w/ numbness of shaft of penis since yesterday.  No associated sx.  Denies trauma but has been having sexual intercourse 3-4x/d for the past several months and had  mild discomfort during intercourse yesterday.  No significant exam findings.  GC/Chlam culture obtained, though I have low suspicion for STD.   Advised f/u with STD clinic if positive, and f/u with urology if sx have not resolved by Monday of next week.  Otilio Miu, PA-C 02/22/13 [phone]

## 2013-02-22 NOTE — ED Provider Notes (Signed)
Medical screening examination/treatment/procedure(s) were performed by non-physician practitioner and as supervising physician I was immediately available for consultation/collaboration.  Jye Fariss, MD 02/22/13 0408 

## 2014-07-12 IMAGING — CR DG CHEST 2V
2 series · 2 of 2 positions shown · non-contrast
Comparison: 04/15/2011

CLINICAL DATA: Chest congestion, productive cough

CHEST - 2 VIEW

[w chest pa *]
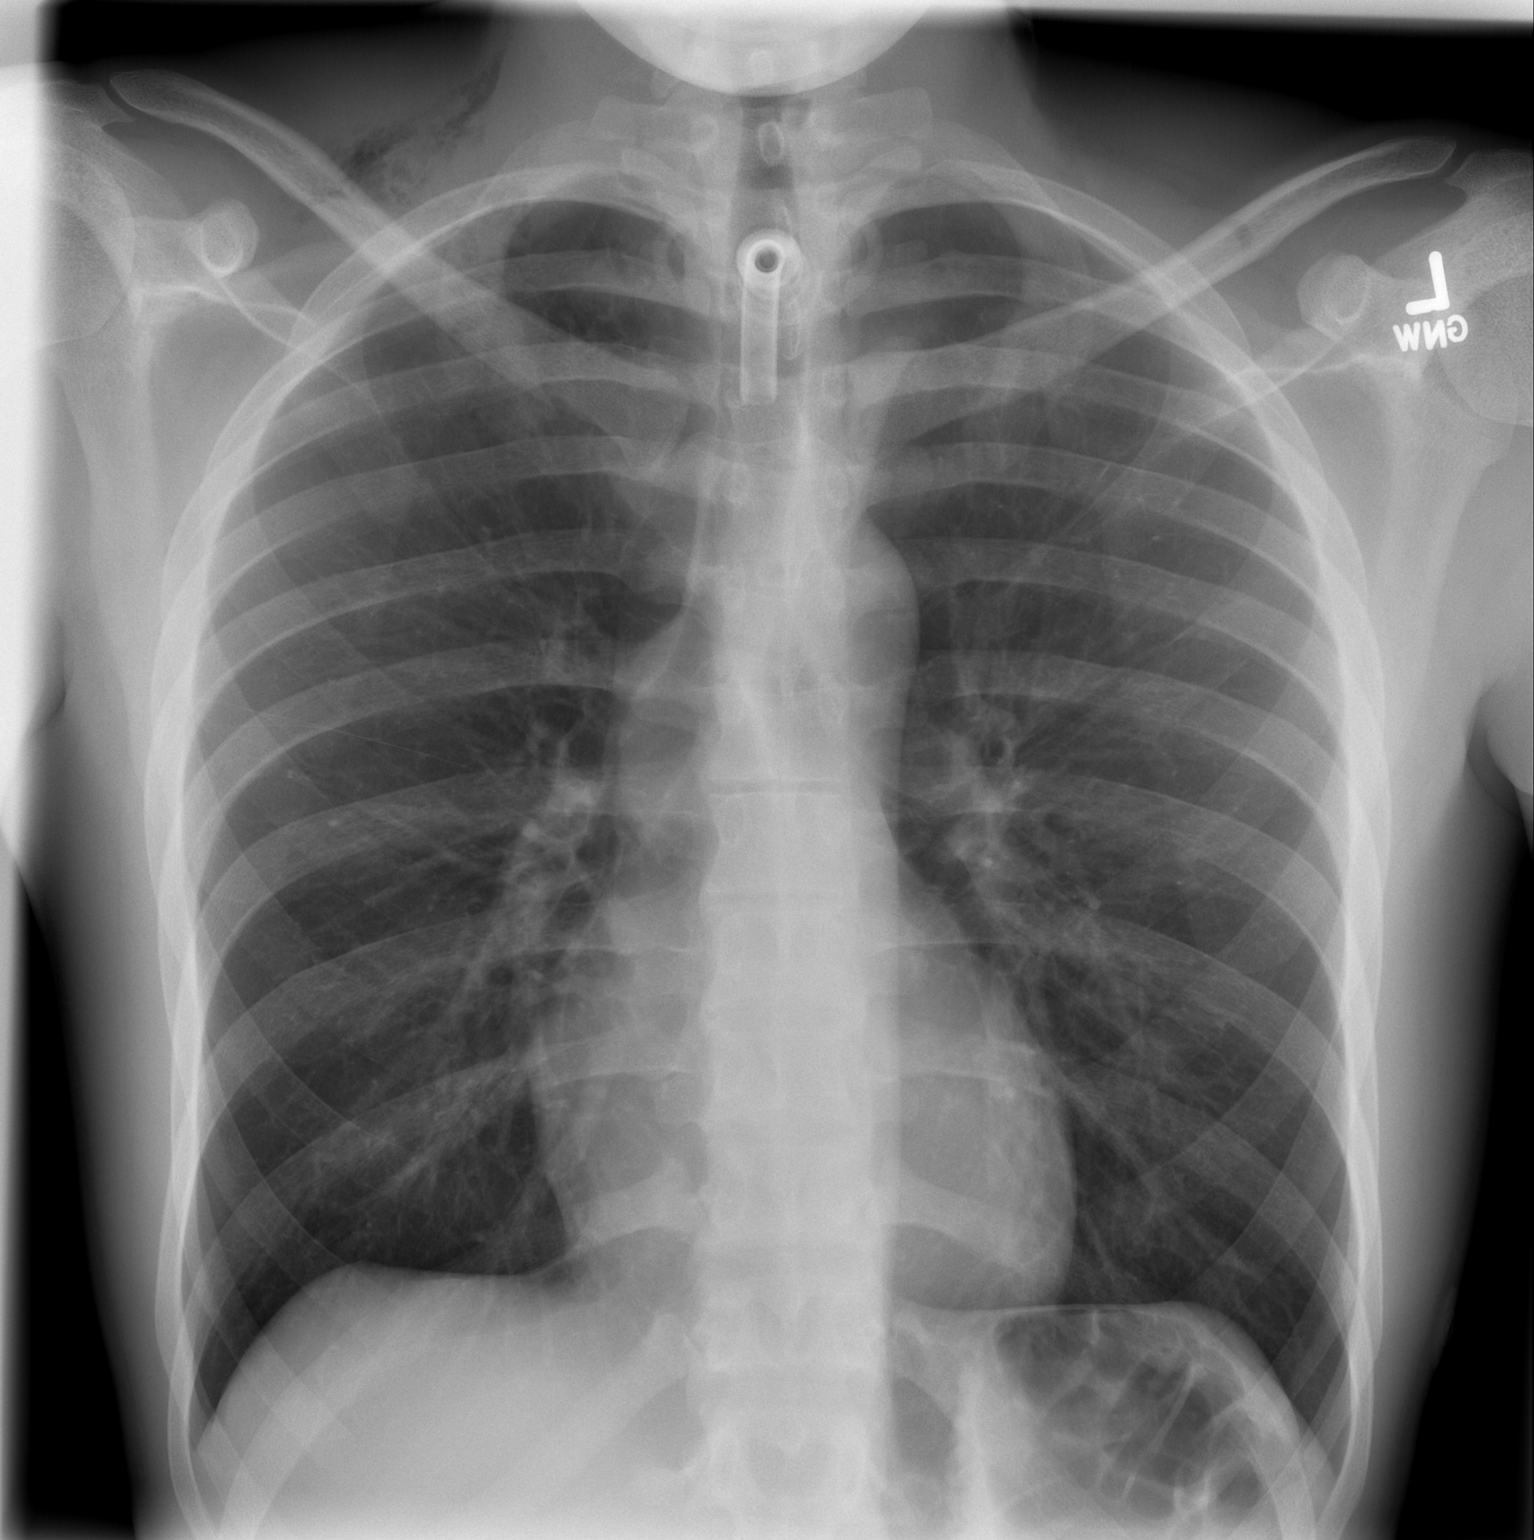

[w chest lat]
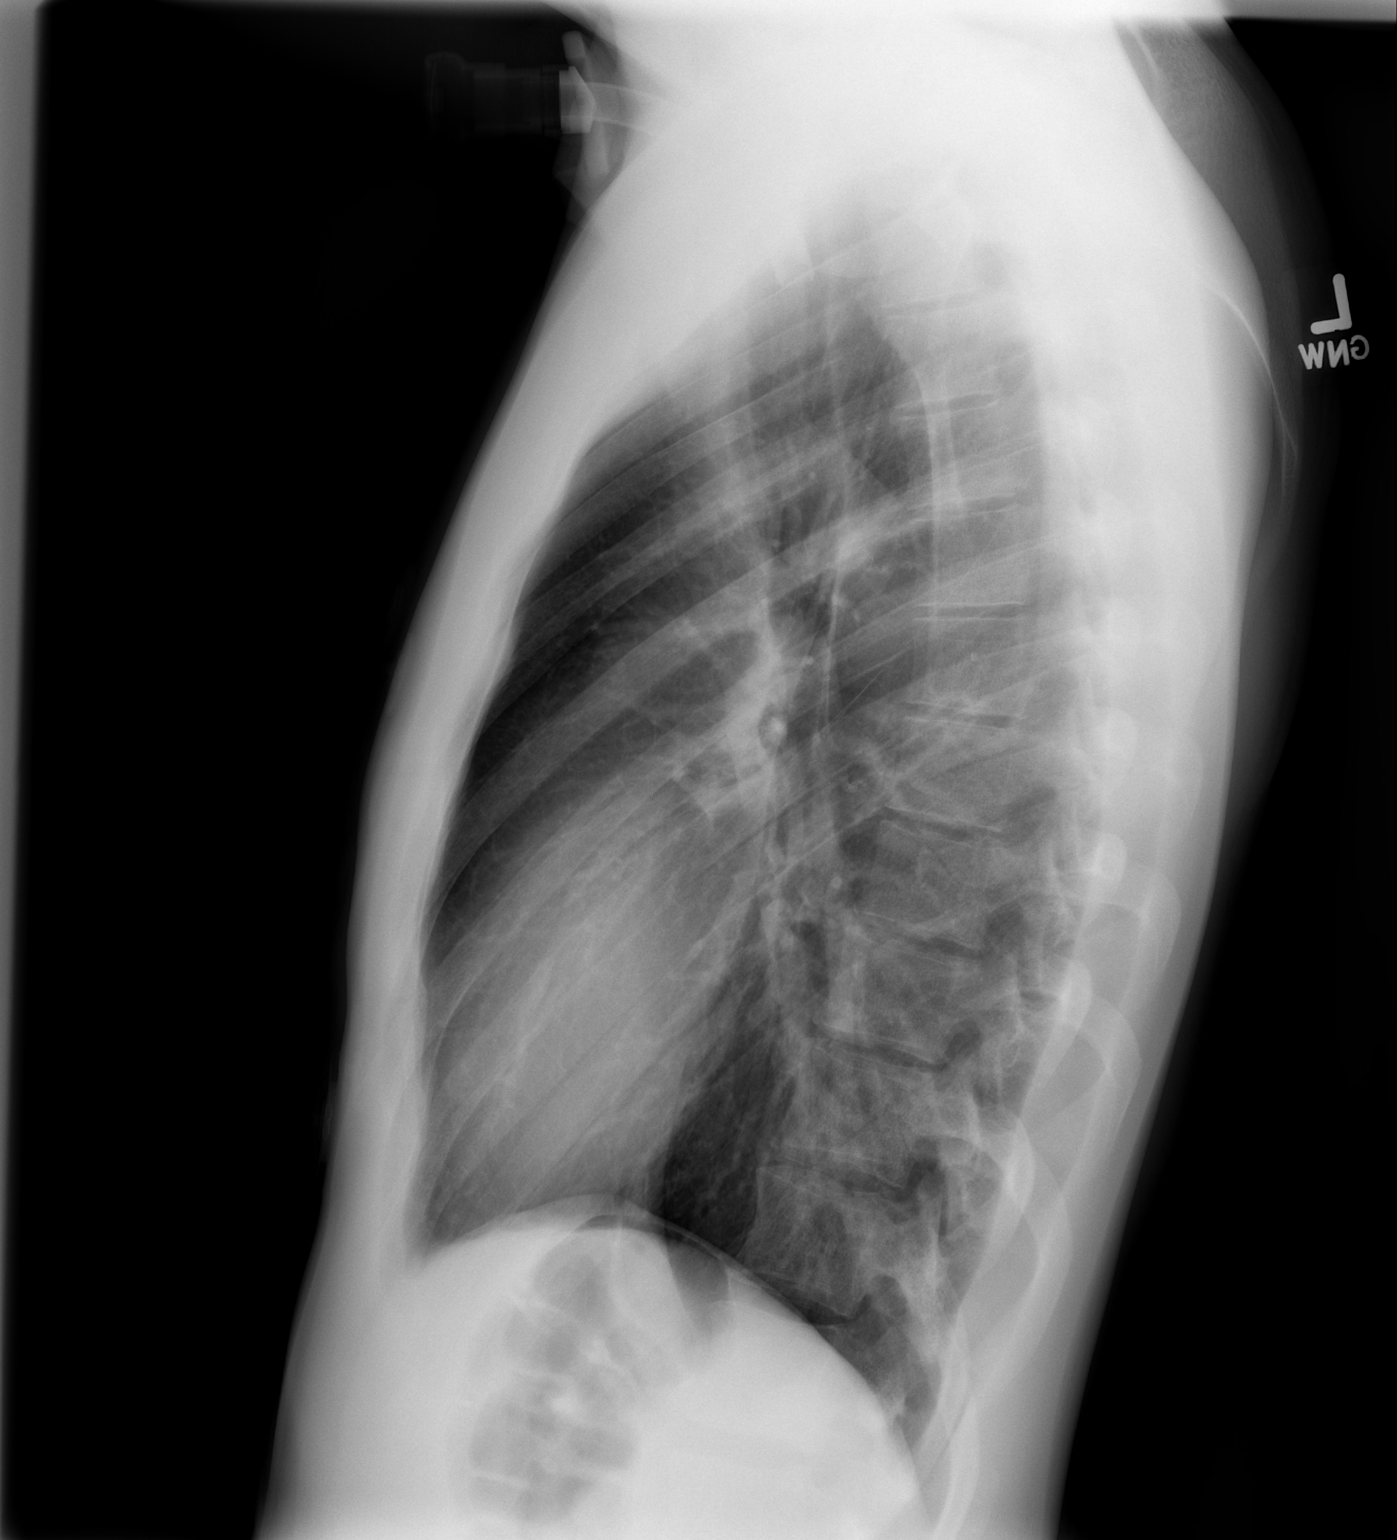

[2 of 2 positions shown; findings below may reference images not displayed]

FINDINGS: Lungs are essentially clear.  No pleural effusion or
pneumothorax.

Interval development of subcutaneous emphysema in the right
neck/upper chest wall.

Tracheostomy at the thoracic inlet.

Cardiomediastinal silhouette is within normal limits.

Visualized osseous structures are within normal limits.
IMPRESSION: Interval development of subcutaneous emphysema in the right
neck/upper chest wall.

No pneumothorax.

Tracheostomy at the thoracic inlet.

## 2016-02-07 ENCOUNTER — Encounter (HOSPITAL_COMMUNITY): Payer: Self-pay | Admitting: *Deleted

## 2016-02-07 ENCOUNTER — Emergency Department (HOSPITAL_COMMUNITY)
Admission: EM | Admit: 2016-02-07 | Discharge: 2016-02-08 | Disposition: A | Discharge status: Home / Self Care | Payer: Self-pay | Attending: Emergency Medicine | Admitting: Emergency Medicine

## 2016-02-07 DIAGNOSIS — F419 Anxiety disorder, unspecified: Secondary | ICD-10-CM | POA: Insufficient documentation

## 2016-02-07 DIAGNOSIS — Z87891 Personal history of nicotine dependence: Secondary | ICD-10-CM | POA: Insufficient documentation

## 2016-02-07 LAB — COMPREHENSIVE METABOLIC PANEL
ALBUMIN: 4.1 g/dL (ref 3.5–5.0)
ALK PHOS: 58 U/L (ref 38–126)
ALT: 20 U/L (ref 17–63)
ANION GAP: 7 (ref 5–15)
AST: 24 U/L (ref 15–41)
BUN: 14 mg/dL (ref 6–20)
CHLORIDE: 104 mmol/L (ref 101–111)
CO2: 27 mmol/L (ref 22–32)
Calcium: 9.9 mg/dL (ref 8.9–10.3)
Creatinine, Ser: 1.24 mg/dL (ref 0.61–1.24)
GFR calc non Af Amer: >60 mL/min (ref >60)
GLUCOSE: 91 mg/dL (ref 65–99)
POTASSIUM: 3.6 mmol/L (ref 3.5–5.1)
SODIUM: 138 mmol/L (ref 135–145)
Total Bilirubin: 0.4 mg/dL (ref 0.3–1.2)
Total Protein: 7.7 g/dL (ref 6.5–8.1)

## 2016-02-07 LAB — CBC WITH DIFFERENTIAL/PLATELET
BASOS PCT: 0 %
Basophils Absolute: 0 10*3/uL (ref 0.0–0.1)
EOS ABS: 0.1 10*3/uL (ref 0.0–0.7)
EOS PCT: 1 %
HCT: 40.9 % (ref 39.0–52.0)
HEMOGLOBIN: 13.3 g/dL (ref 13.0–17.0)
Lymphocytes Relative: 25 %
Lymphs Abs: 2.7 10*3/uL (ref 0.7–4.0)
MCH: 29.6 pg (ref 26.0–34.0)
MCHC: 32.5 g/dL (ref 30.0–36.0)
MCV: 90.9 fL (ref 78.0–100.0)
MONOS PCT: 5 %
Monocytes Absolute: 0.6 10*3/uL (ref 0.1–1.0)
NEUTROS PCT: 69 %
Neutro Abs: 7.5 10*3/uL (ref 1.7–7.7)
PLATELETS: 357 10*3/uL (ref 150–400)
RBC: 4.5 MIL/uL (ref 4.22–5.81)
RDW: 12.5 % (ref 11.5–15.5)
WBC: 10.9 10*3/uL — ABNORMAL HIGH (ref 4.0–10.5)

## 2016-02-07 LAB — URINALYSIS, ROUTINE W REFLEX MICROSCOPIC
Bilirubin Urine: NEGATIVE
GLUCOSE, UA: NEGATIVE mg/dL
Hgb urine dipstick: NEGATIVE
KETONES UR: NEGATIVE mg/dL
LEUKOCYTES UA: NEGATIVE
NITRITE: NEGATIVE
PH: 6 (ref 5.0–8.0)
Protein, ur: NEGATIVE mg/dL
SPECIFIC GRAVITY, URINE: 1.024 (ref 1.005–1.030)

## 2016-02-07 NOTE — ED Triage Notes (Signed)
Patient presents stating he feels like he had to gasp for air, constant numbness to the left side of his face which he states is probably from and accident he had a while back.  Had a trach and feels like he doesn't swallow

## 2016-02-08 ENCOUNTER — Emergency Department (HOSPITAL_COMMUNITY): Payer: Self-pay

## 2016-02-08 LAB — TROPONIN I

## 2016-02-08 MED ORDER — LORAZEPAM 1 MG PO TABS: 1.0000 mg | ORAL_TABLET | Freq: Three times a day (TID) | ORAL | 0 refills | Status: DC | PRN

## 2016-02-08 NOTE — ED Notes (Signed)
Called lab and added on troponin 

## 2016-02-08 NOTE — Discharge Instructions (Signed)
To find a primary care or specialty doctor please call [phone] or [phone] to access "[facility] Find a Doctor Service." ° °You may also go on the [facility] website at [email or web address] ° °There are also multiple [facility] and [facility] practices throughout the [place] that are frequently accepting new patients. You may find a clinic that is close to your home and contact them. ° °[facility] -  °[address] °[place] [address] °[phone] ° °[facility] in [place] (also locations in [place] and [place]) -  °[address] °[place] [address] °[phone] ° °[facility] -  °[address] °[place] [address] °[phone] ° ° °

## 2016-02-08 NOTE — ED Notes (Signed)
Patient verbalized understanding of discharge instructions and denies any further needs or questions at this time. VS stable. Patient ambulatory with steady gait.  

## 2016-02-08 NOTE — ED Provider Notes (Signed)
First MD Initiated Contact with Patient 02/08/16 0039      By signing my name below, I, Suzan SlickAshley N. Elon SpannerLeger, attest that this documentation has been prepared under the direction and in the presence of Kristen N Ward, DO.  Electronically Signed: Suzan SlickAshley N. Elon SpannerLeger, ED Scribe. 02/08/16. 12:50 AM.   TIME SEEN: 12:39 AM   CHIEF COMPLAINT:  Chief Complaint  Patient presents with  . Other    Multiple Complaints     HPI:   HPI Comments: Gabriel Nguyen is a 25 y.o. male with a PMHx of anxiety and s/p closed reduction mandible 04/2012 and tracheostomy tube placement/closure 2013 who presents to the Emergency Department complaining of intermittent, ongoing numbness to the L side of face since ORIF in 2013.  He describes that the tingling waxes and wanes and has been worse over the past 2 weeks. Denies any new injury or trauma. Pt also reports "uneven pressure in my head" and  "a feeling of having to gasp for air" x 2 days. Episodes tend to last for a couple seconds and spontaneously resolves. New onset productive cough consisting of mild mucous reported. However, pt states he was recently diagnosed with tonsillitis which he is improving from. His girlfriend reports that she thinks because of the swelling of his tonsils he is having anxiety which is causing his new symptoms of feeling like he can't catch his breath. Denies feeling short of breath currently but does report some chest tightness. No aggravating or alleviating factors reported. No recent fever, chills, nausea, vomiting, or diarrhea. Pt admits to Vaping occasionally. No history of PE, DVT, exogenous estrogen use, fracture, surgery, trauma, hospitalization, prolonged travel. No lower extremity swelling or pain. No calf tenderness.   PCP: Gabriel Nguyen, Gabriel Nguyen (Inactive)    ROS: See HPI Constitutional: no fever  Eyes: no drainage  ENT: no runny nose. Positive cough Cardiovascular:   chest pain  Resp: Positive SOB  GI: no vomiting GU: no  dysuria Integumentary: no rash  Allergy: no hives  Musculoskeletal: no leg swelling  Neurological: no slurred speech. Positive numbness ROS otherwise negative  PAST MEDICAL HISTORY/PAST SURGICAL HISTORY:  Past Medical History:  Diagnosis Date  . Anxiety    pt. 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    MEDICATIONS:  Prior to Admission medications   Not on File    ALLERGIES:  No Known Allergies  SOCIAL HISTORY:  Social History  Substance Use Topics  . Smoking status: Former Smoker    Packs/day: 0.50    Types: Cigarettes    Quit date: 04/13/2012  . Smokeless tobacco: Never Used  . Alcohol use Yes     Comment: once a month- beer, rare hard liquor    FAMILY HISTORY: No family history on file.  EXAM: BP 132/88 (BP Location: Left Arm)   Pulse 85   Temp 98.3 F (36.8 C) (Oral)   Resp 18   Ht 6\' 5"  (1.956 m)   Wt 185 lb (83.9 kg)   SpO2 98%   BMI 21.94 kg/m  CONSTITUTIONAL: Alert and oriented and responds appropriately to questions. Well-appearing; well-nourished. He appears very anxious HEAD: Normocephalic EYES: Conjunctivae clear, PERRL ENT: normal nose; no rhinorrhea; moist mucous membranes; No pharyngeal erythema or petechiae, no tonsillar hypertrophy or exudate, no uvular deviation, no trismus or drooling, normal phonation, no stridor, no dental caries or abscess noted, no Ludwig's angina, tongue sits flat in the bottom  of the mouth; no facial swelling, erythema or warmth, no angioedema NECK: Supple, no meningismus, no LAD  CARD: RRR; S1 and S2 appreciated; no murmurs, no clicks, no rubs, no gallops RESP: Normal chest excursion without splinting or tachypnea; breath sounds clear and equal bilaterally; no wheezes, no rhonchi, no rales, no hypoxia or respiratory distress, speaking full sentences ABD/GI: Normal bowel sounds; non-distended; soft, non-tender, no rebound, no guarding, no peritoneal  signs BACK:  The back appears normal and is non-tender to palpation, there is no CVA tenderness EXT: Normal ROM in all joints; non-tender to palpation; no edema; normal capillary refill; no cyanosis, no calf tenderness or swelling    SKIN: Normal color for age and race; warm; no rash NEURO: Moves all extremities equally, Reports decreased sensation over the left mandible but otherwise sensation to light touch intact diffusely, cranial nerves II through XII intact PSYCH: The patient's mood and manner are appropriate. Grooming and personal hygiene are appropriate.  MEDICAL DECISION MAKING: Patient here with what appears to be anxiety. He complains of feeling like he can't get his breath intermittently. Girlfriend states he has had this many times in the past, worse after recently being diagnosed with tonsillitis. He states he had an episode after his "accident" in 2013 where he passed out and fractured his mandible. States that he woke up after surgery with his "throat swollen" and required a tracheostomy. Because of this he is very anxious whenever he feels that he is having a sore throat. States that he has also had intermittent numbness and tingling in the left jaw since his ORIF in 2013. No new injury to this area. No sign of facial cellulitis, dental abscess. No sign of deep space neck infection currently, PTA, pharyngitis. Labs ordered in triage are unremarkable. He does report some chest tightness. No risk factors for ACS other than vaping.  No risk factors for PE. We'll obtain EKG, chest x-ray. Have offered Ativan but patient states he would like to drive home but is willing to try prescription at home.  ED PROGRESS: Patient's troponin is negative. Chest x-ray clear. EKG shows signs of early repolarization that have been unchanged compared to prior EKGs back to 2013.-He is safe to be discharged home with prescription for Ativan. Will give outpatient follow-up information as well.   At this time, I  do not feel there is any life-threatening condition present. I have reviewed and discussed all results (EKG, imaging, lab, urine as appropriate), exam findings with patient/family. I have reviewed nursing notes and appropriate previous records.  I feel the patient is safe to be discharged home without further emergent workup and can continue workup as an outpatient. Discussed usual and customary return precautions. Patient/family verbalize understanding and are comfortable with this plan.  Outpatient follow-up has been provided. All questions have been answered.     EKG Interpretation  Date/Time:  Sunday February 08 2016 01:37:43 EDT Ventricular Rate:  62 PR Interval:    QRS Duration: 98 QT Interval:  396 QTC Calculation: 403 R Axis:   71 Text Interpretation:  Sinus rhythm Early repolarization No significant change since last tracing Confirmed by WARD,  DO, KRISTEN [phone]) on 02/08/2016 2:11:30 AM         I personally performed the services described in this documentation, which was scribed in my presence. The recorded information has been reviewed and is accurate.    Layla Maw Ward, DO 02/08/16 [phone]

## 2016-09-04 ENCOUNTER — Emergency Department (HOSPITAL_COMMUNITY): Payer: Self-pay

## 2016-09-04 ENCOUNTER — Emergency Department (HOSPITAL_COMMUNITY)
Admission: EM | Admit: 2016-09-04 | Discharge: 2016-09-04 | Disposition: A | Discharge status: Home / Self Care | Payer: Self-pay | Attending: Emergency Medicine | Admitting: Emergency Medicine

## 2016-09-04 ENCOUNTER — Encounter (HOSPITAL_COMMUNITY): Payer: Self-pay | Admitting: *Deleted

## 2016-09-04 DIAGNOSIS — H209 Unspecified iridocyclitis: Secondary | ICD-10-CM

## 2016-09-04 DIAGNOSIS — Z87891 Personal history of nicotine dependence: Secondary | ICD-10-CM | POA: Insufficient documentation

## 2016-09-04 DIAGNOSIS — K122 Cellulitis and abscess of mouth: Secondary | ICD-10-CM

## 2016-09-04 DIAGNOSIS — B5809 Other toxoplasma oculopathy: Secondary | ICD-10-CM | POA: Insufficient documentation

## 2016-09-04 DIAGNOSIS — J029 Acute pharyngitis, unspecified: Secondary | ICD-10-CM

## 2016-09-04 LAB — RAPID STREP SCREEN (MED CTR MEBANE ONLY): STREPTOCOCCUS, GROUP A SCREEN (DIRECT): NEGATIVE

## 2016-09-04 MED ORDER — DEXAMETHASONE SODIUM PHOSPHATE 10 MG/ML IJ SOLN
10.0000 mg | Freq: Once | INTRAMUSCULAR | Status: AC
  Administered 2016-09-04: 10 mg via INTRAMUSCULAR
  Filled 2016-09-04: qty 1

## 2016-09-04 MED ORDER — IBUPROFEN 400 MG PO TABS
600.0000 mg | ORAL_TABLET | Freq: Once | ORAL | Status: AC
  Administered 2016-09-04: 600 mg via ORAL
  Filled 2016-09-04: qty 1

## 2016-09-04 MED ORDER — DIPHENHYDRAMINE HCL 25 MG PO CAPS
50.0000 mg | ORAL_CAPSULE | Freq: Once | ORAL | Status: AC
  Administered 2016-09-04: 50 mg via ORAL
  Filled 2016-09-04: qty 2

## 2016-09-04 NOTE — Discharge Instructions (Signed)
Your x-ray was normal. Her strep test was negative. This is likely a allergic reaction. Please take Benadryl 4 times a day. Avoid hot foods. Use cold foods for comfort. May take Motrin and Tylenol for pain. Have given you a referral to the ENT to follow-up next week. Return to the ED if he felt like you are having difficulties breathing, difficulty swallowing or for any other reason.

## 2016-09-04 NOTE — ED Provider Notes (Signed)
[facility]-EMERGENCY DEPT Provider Note   CSN: [id number] Arrival date & time: 09/04/16  1914     History   Chief Complaint Chief Complaint  Patient presents with  . Sore Throat    HPI Gabriel Nguyen is a 26 y.o. male.  HPI 33 African American male past medical history significant for closed reduction mandibular fracture that required tracheostomy presents to the ED today with complaints of sore throat, swollen uvula onset this a.m. Patient states that he woke up with a sore throat. He looked in the mirror and saw that his tonsils and uvula was swollen. For like it was difficult to swallow. Was concerned given his history of tracheostomy and came to the ED for evaluation. Patient states that it felt like he was having difficulty breathing but that has subsided at this time. He denies any rhinorrhea, fevers, cough. Has not tried nothing for this swelling and pain prior to arrival. Denies any new medications or foods that could be causing any anaphylaxis or angioedema. Patient denies any trismus, neck stiffness, muffled voice. Past Medical History:  Diagnosis Date  . Anxiety    pt. 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    There are no active problems to display for this patient.   Past Surgical History:  Procedure Laterality Date  . CLOSED REDUCTION MANDIBLE  04/17/2012   Procedure: CLOSED REDUCTION MANDIBULAR;  Surgeon: Melvenia Beam, MD;  Location: [facility] OR;  Service: ENT;  Laterality: N/A;  . LACERATION REPAIR  04/13/2012   Procedure: REPAIR MULTIPLE LACERATIONS;  Surgeon: Melvenia Beam, MD;  Location: [facility] OR;  Service: ENT;;  Repair of chin laceration  . MANDIBULAR HARDWARE REMOVAL  05/26/2012   Procedure: MANDIBULAR HARDWARE REMOVAL;  Surgeon: Melvenia Beam, MD;  Location: [facility] OR;  Service: ENT;  Laterality: N/A;  . ORIF MANDIBULAR FRACTURE  04/13/2012   Procedure: OPEN REDUCTION INTERNAL FIXATION (ORIF) MANDIBULAR  FRACTURE;  Surgeon: Melvenia Beam, MD;  Location: [facility] OR;  Service: ENT;  Laterality: N/A;  . TRACHEOSTOMY CLOSURE  05/26/2012   Procedure: TRACHEOSTOMY CLOSURE;  Surgeon: Melvenia Beam, MD;  Location: [facility] OR;  Service: ENT;  Laterality: N/A;  . TRACHEOSTOMY TUBE PLACEMENT  04/14/2012   Procedure: TRACHEOSTOMY;  Surgeon: Melvenia Beam, MD;  Location: [facility] OR;  Service: ENT;  Laterality: N/A;       Home Medications    Prior to Admission medications   Medication Sig Start Date End Date Taking? Authorizing Provider  LORazepam (ATIVAN) 1 MG tablet Take 1 tablet (1 mg total) by mouth 3 (three) times daily as needed for anxiety. 02/08/16   Layla Maw Ward, DO    Family History No family history on file.  Social History Social History  Substance Use Topics  . Smoking status: Former Smoker    Packs/day: 0.50    Types: Cigarettes    Quit date: 04/13/2012  . Smokeless tobacco: Never Used  . Alcohol use Yes     Comment: once a month- beer, rare hard liquor     Allergies   Patient has no known allergies.   Review of Systems Review of Systems  Constitutional: Negative for chills and fever.  HENT: Positive for sore throat and trouble swallowing. Negative for congestion and voice change.   Respiratory: Negative for cough.   Gastrointestinal: Negative for nausea and vomiting.  All other systems reviewed and are negative.    Physical Exam Updated Vital Signs BP 126/87 (BP Location:  Right Arm)   Pulse 105   Temp 98 F (36.7 C) (Oral)   Resp 16   Ht 6\' 5"  (1.956 m)   Wt 97.5 kg   SpO2 98%   BMI 25.50 kg/m   Physical Exam  Constitutional: He appears well-developed and well-nourished. No distress.  Patient is nontoxic appearing. He is speaking complete sentences without any difficulty swallowing.  HENT:  Head: Normocephalic and atraumatic.  Right Ear: Tympanic membrane, external ear and ear canal normal.  Left Ear: Tympanic membrane, external ear and ear canal normal.  Nose:  Mucosal edema and rhinorrhea present.  Mouth/Throat: Uvula is midline and mucous membranes are normal. No trismus in the jaw. Uvula swelling present. Posterior oropharyngeal edema and posterior oropharyngeal erythema present. No oropharyngeal exudate or tonsillar abscesses. Tonsils are 2+ on the right. Tonsils are 2+ on the left. No tonsillar exudate.  Edema to the uvula is noted without any exudates or erythema. There is no airway compromise. Bilateral tonsillar swelling without any exudates. Oropharynx are clear and open. No trismus. Voice is hoarse but not muffled. No stridor noted. No sublingual or submandibular swelling.  Eyes: Right eye exhibits no discharge. Left eye exhibits no discharge. No scleral icterus.  Neck: Normal range of motion. Neck supple.  Full range of motion. No cervical lymphadenopathy.  Pulmonary/Chest: No respiratory distress.  No stridor. Maintaining airway  Musculoskeletal: Normal range of motion.  Lymphadenopathy:    He has no cervical adenopathy.  Neurological: He is alert.  Skin: Skin is warm and dry. Capillary refill takes less than 2 seconds. No pallor.  Nursing note and vitals reviewed.    ED Treatments / Results  Labs (all labs ordered are listed, but only abnormal results are displayed) Labs Reviewed  RAPID STREP SCREEN (NOT AT [facility])  CULTURE, GROUP A STREP [facility])    EKG  EKG Interpretation None       Radiology Dg Neck Soft Tissue  Result Date: 09/04/2016 CLINICAL DATA:  Pt c/o swollen tonsils and uvula. Pt denies fever or any other symptoms. Pt reports hardware placed in lower jaw in 2013. EXAM: NECK SOFT TISSUES - 1+ VIEW COMPARISON:  04/22/2012 FINDINGS: Status post ORIF of the mandible. There is soft tissue swelling of region of the adenoids. The epiglottis and laryngeal soft tissues have a normal appearance. No foreign body identified. Normal alignment of the cervical spine. IMPRESSION: Adenoidal soft tissue swelling. Electronically Signed    By: Norva Pavlov M.D.   On: 09/04/2016 09:50    Procedures Procedures (including critical care time)  Medications Ordered in ED Medications  diphenhydrAMINE (BENADRYL) capsule 50 mg (not administered)  dexamethasone (DECADRON) injection 10 mg (10 mg Intramuscular Given 09/04/16 0904)  ibuprofen (ADVIL,MOTRIN) tablet 600 mg (600 mg Oral Given 09/04/16 0904)     Initial Impression / Assessment and Plan / ED Course  I have reviewed the triage vital signs and the nursing notes.  Pertinent labs & imaging results that were available during my care of the patient were reviewed by me and considered in my medical decision making (see chart for details).     Patient presents to the ED with complaints of tonsillar and uvular swelling. Patient is nontoxic appearing. Denies any fevers. She is afebrile without any tonsillar exudates, negative strep. He has had mild cervical lymphadenopathy with edema to the uvula without any redness or exudates. Oropharynx without any airway compromise. Mild dysphasia. No cervical lymphadenopathy. This is likely a viral pharyngitis/uvulitis versus allergic reaction. No new medications  or foods. Doubt angioedema. There is no trismus, uvula deviation or neck stiffness. Doubt PTA or deep neck infection. X-ray shows soft tissue swelling of the adenoids but no other focal abdomen bowel is concerning for epiglottitis. Patient without stridor or airway, demise. He is speaking complete sentences and maintaining airway. States he feels significantly improved after the steroids. Have given him Benadryl. Encouraged Motrin and Tylenol for pain. Patient is able to tolerate by mouth fluids without difficulties with intact airway. Encouraged to follow up with ENT. Given strict return precautions if he develops any difficulty swallowing or breathing. Patient was seen and evaluated by Dr. Clydene PughKnott who is agreeable to the above plan. Patient is hemodynamically stable with normal vital signs. Feel  safe for discharge. Has no questions prior to discharge.  .   Final Clinical Impressions(s) / ED Diagnoses   Final diagnoses:  Uveitis  Sore throat  Uvulitis    New Prescriptions New Prescriptions   No medications on file     Rise MuKenneth T Leaphart, PA-C 09/04/16 1039    Lyndal Pulleyaniel Knott, MD 09/04/16 1759    Lyndal Pulleyaniel Knott, MD 09/04/16 [phone]

## 2016-09-04 NOTE — ED Triage Notes (Signed)
Pt c/o sore throat, swollen uvula, pt has a few white patches to L tonsils, swelling noted, denies fever & chills, denies cough, onset symptoms today, A&O x4

## 2016-09-04 NOTE — ED Notes (Signed)
Apple juice given to family upon pt request.

## 2016-09-06 LAB — CULTURE, GROUP A STREP (THRC)

## 2019-01-30 ENCOUNTER — Emergency Department (HOSPITAL_COMMUNITY)
Admission: EM | Admit: 2019-01-30 | Discharge: 2019-01-31 | Disposition: A | Discharge status: Home / Self Care | Payer: BC Managed Care – PPO | Attending: Emergency Medicine | Admitting: Emergency Medicine

## 2019-01-30 ENCOUNTER — Other Ambulatory Visit: Payer: Self-pay

## 2019-01-30 ENCOUNTER — Encounter (HOSPITAL_COMMUNITY): Payer: Self-pay | Admitting: Emergency Medicine

## 2019-01-30 DIAGNOSIS — S199XXA Unspecified injury of neck, initial encounter: Secondary | ICD-10-CM

## 2019-01-30 DIAGNOSIS — Y92019 Unspecified place in single-family (private) house as the place of occurrence of the external cause: Secondary | ICD-10-CM | POA: Insufficient documentation

## 2019-01-30 DIAGNOSIS — M542 Cervicalgia: Secondary | ICD-10-CM | POA: Diagnosis present

## 2019-01-30 DIAGNOSIS — Y9389 Activity, other specified: Secondary | ICD-10-CM | POA: Insufficient documentation

## 2019-01-30 DIAGNOSIS — Y999 Unspecified external cause status: Secondary | ICD-10-CM | POA: Insufficient documentation

## 2019-01-30 DIAGNOSIS — Z87891 Personal history of nicotine dependence: Secondary | ICD-10-CM | POA: Insufficient documentation

## 2019-01-30 DIAGNOSIS — W541XXA Struck by dog, initial encounter: Secondary | ICD-10-CM | POA: Insufficient documentation

## 2019-01-30 NOTE — ED Triage Notes (Signed)
Pt reports his 65 ib dog stepped on his neck while playing last night, this morning he noticed his throat was sore and he felt like his adams apple was shifted to the left more than normal. Pt denies any difficulty swallowing or breathing, speaking in clear sentences without difficulty. Hx of trach 6 years ago.

## 2019-01-31 ENCOUNTER — Emergency Department (HOSPITAL_COMMUNITY): Payer: BC Managed Care – PPO

## 2019-01-31 LAB — CBC WITH DIFFERENTIAL/PLATELET
Abs Immature Granulocytes: 0.02 10*3/uL (ref 0.00–0.07)
Basophils Absolute: 0 10*3/uL (ref 0.0–0.1)
Basophils Relative: 1 %
Eosinophils Absolute: 0.1 10*3/uL (ref 0.0–0.5)
Eosinophils Relative: 1 %
HCT: 43.5 % (ref 39.0–52.0)
Hemoglobin: 14.4 g/dL (ref 13.0–17.0)
Immature Granulocytes: 0 %
Lymphocytes Relative: 30 %
Lymphs Abs: 2.2 10*3/uL (ref 0.7–4.0)
MCH: 29.8 pg (ref 26.0–34.0)
MCHC: 33.1 g/dL (ref 30.0–36.0)
MCV: 89.9 fL (ref 80.0–100.0)
Monocytes Absolute: 0.6 10*3/uL (ref 0.1–1.0)
Monocytes Relative: 8 %
Neutro Abs: 4.5 10*3/uL (ref 1.7–7.7)
Neutrophils Relative %: 60 %
Platelets: 296 10*3/uL (ref 150–400)
RBC: 4.84 MIL/uL (ref 4.22–5.81)
RDW: 12.6 % (ref 11.5–15.5)
WBC: 7.5 10*3/uL (ref 4.0–10.5)
nRBC: 0 % (ref 0.0–0.2)

## 2019-01-31 LAB — POCT I-STAT EG7
Acid-Base Excess: 3 mmol/L — ABNORMAL HIGH (ref 0.0–2.0)
Bicarbonate: 30.2 mmol/L — ABNORMAL HIGH (ref 20.0–28.0)
Calcium, Ion: 1.28 mmol/L (ref 1.15–1.40)
HCT: 47 % (ref 39.0–52.0)
Hemoglobin: 16 g/dL (ref 13.0–17.0)
O2 Saturation: 59 %
Potassium: 4.2 mmol/L (ref 3.5–5.1)
Sodium: 140 mmol/L (ref 135–145)
TCO2: 32 mmol/L (ref 22–32)
pCO2, Ven: 54.2 mmHg (ref 44.0–60.0)
pH, Ven: 7.354 (ref 7.250–7.430)
pO2, Ven: 33 mmHg (ref 32.0–45.0)

## 2019-01-31 LAB — I-STAT CREATININE, ED: Creatinine, Ser: 1.2 mg/dL (ref 0.61–1.24)

## 2019-01-31 MED ORDER — IOPAMIDOL (ISOVUE-300) INJECTION 61%
75.0000 mL | Freq: Once | INTRAVENOUS | Status: AC | PRN
  Administered 2019-01-31: 75 mL via INTRAVENOUS

## 2019-01-31 NOTE — ED Provider Notes (Signed)
[facility] EMERGENCY DEPARTMENT Provider Note   CSN: [id number] Arrival date & time: 01/30/19  2012     History   Chief Complaint Chief Complaint  Patient presents with  . throat pain    HPI Gabriel Nguyen is a 28 y.o. male.     The history is provided by the patient and medical records.    28 year old male with history of anxiety, presenting to the ED with neck pain and swelling.  Patient reports last evening his 55 pound pitbull/German Shepherd mix stepped on his throat.  States since that time he has felt swelling over his "Adam's apple" and feels like it is shifted to the left.  He does report some difficulty swallowing.  He denies any shortness of breath.  He is not had any fever or chills.  No sick contacts.  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  States once that was removed he has not had any significant issues.  No meds PTA.  Past Medical History:  Diagnosis Date  . Anxiety    pt. 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    There are no active problems to display for this patient.   Past Surgical History:  Procedure Laterality Date  . CLOSED REDUCTION MANDIBLE  04/17/2012   Procedure: CLOSED REDUCTION MANDIBULAR;  Surgeon: Ruby Cola, MD;  Location: [facility] OR;  Service: ENT;  Laterality: N/A;  . LACERATION REPAIR  04/13/2012   Procedure: REPAIR MULTIPLE LACERATIONS;  Surgeon: Ruby Cola, MD;  Location: [place];  Service: ENT;;  Repair of chin laceration  . MANDIBULAR HARDWARE REMOVAL  05/26/2012   Procedure: MANDIBULAR HARDWARE REMOVAL;  Surgeon: Ruby Cola, MD;  Location: [place];  Service: ENT;  Laterality: N/A;  . ORIF MANDIBULAR FRACTURE  04/13/2012   Procedure: OPEN REDUCTION INTERNAL FIXATION (ORIF) MANDIBULAR FRACTURE;  Surgeon: Ruby Cola, MD;  Location: [facility] OR;  Service: ENT;  Laterality:  N/A;  . TRACHEOSTOMY CLOSURE  05/26/2012   Procedure: TRACHEOSTOMY CLOSURE;  Surgeon: Ruby Cola, MD;  Location: [place];  Service: ENT;  Laterality: N/A;  . TRACHEOSTOMY TUBE PLACEMENT  04/14/2012   Procedure: TRACHEOSTOMY;  Surgeon: Ruby Cola, MD;  Location: [place];  Service: ENT;  Laterality: N/A;        Home Medications    Prior to Admission medications   Medication Sig Start Date End Date Taking? Authorizing Provider  LORazepam (ATIVAN) 1 MG tablet Take 1 tablet (1 mg total) by mouth 3 (three) times daily as needed for anxiety. 02/08/16   Ward, Delice Bison, DO    Family History No family history on file.  Social History Social History   Tobacco Use  . Smoking status: Former Smoker    Packs/day: 0.50    Types: Cigarettes    Quit date: 04/13/2012    Years since quitting: 6.8  . Smokeless tobacco: Never Used  Substance Use Topics  . Alcohol use: Yes    Comment: once a month- beer, rare hard liquor  . Drug use: No     Allergies   Patient has no known allergies.   Review of Systems Review of Systems  Musculoskeletal: Positive for neck pain.  All other systems reviewed and are negative.    Physical Exam Updated Vital Signs BP (!) 127/94   Pulse (!) 56   Temp 98.1 F (36.7 C) (Oral)  Resp 20   SpO2 100%   Physical Exam Vitals signs and nursing note reviewed.  Constitutional:      Appearance: He is well-developed.  HENT:     Head: Normocephalic and atraumatic.     Mouth/Throat:     Comments: No tonsillar edema or exudates, no swelling of the uvula, handling secretions well, normal phonation Eyes:     Conjunctiva/sclera: Conjunctivae normal.     Pupils: Pupils are equal, round, and reactive to light.  Neck:     Musculoskeletal: Normal range of motion.     Comments: Prior tracheostomy scar noted No bruising or erythema noted to anterior neck; there is tenderness along left side including the hyoid, no appreciable swelling or hematoma noted, trachea  appears midline, normal phonation, some pain elicited with swallowing Cardiovascular:     Rate and Rhythm: Normal rate and regular rhythm.     Heart sounds: Normal heart sounds.  Pulmonary:     Effort: Pulmonary effort is normal.     Breath sounds: Normal breath sounds.  Abdominal:     General: Bowel sounds are normal.     Palpations: Abdomen is soft.  Musculoskeletal: Normal range of motion.  Skin:    General: Skin is warm and dry.  Neurological:     Mental Status: He is alert and oriented to person, place, and time.      ED Treatments / Results  Labs (all labs ordered are listed, but only abnormal results are displayed) Labs Reviewed  POCT I-STAT EG7 - Abnormal; Notable for the following components:      Result Value   Bicarbonate 30.2 (*)    Acid-Base Excess 3.0 (*)    All other components within normal limits  CBC WITH DIFFERENTIAL/PLATELET  I-STAT CREATININE, ED    EKG None  Radiology No results found.  Procedures Procedures (including critical care time)  Medications Ordered in ED Medications - No data to display   Initial Impression / Assessment and Plan / ED Course  I have reviewed the triage vital signs and the nursing notes.  Pertinent labs & imaging results that were available during my care of the patient were reviewed by me and considered in my medical decision making (see chart for details).  28 year old male presenting to the ED with left-sided neck pain.  States his 55 pound dog stepped on his neck last evening and he has had worsening pain since.  States it feels swollen inside.  He does report some difficulty swallowing but is able to drink water.  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  He is afebrile and nontoxic.  He denies any sick contacts.  He does not have any tonsillar edema or exudates, uvula is midline.  States he feels like his "Adam's apple" is shifted to the left but I  do not appreciate this on exam.  There is no visible swelling or hematoma, some tenderness noted to left side of hyoid.  Trachea appears midline.  He has no signs of respiratory distress and vitals are stable.  Labs are reassuring.  Will check CT of the neck to ensure no underlying injuries.  CT pending.  Care signed out to oncoming provider to follow-up on CT and reassess.  If no acute findings, likely discharge with supportive care.  Final Clinical Impressions(s) / ED Diagnoses   Final diagnoses:  Neck pain    ED Discharge Orders    None       Allyne GeeSanders,  Rosezella FloridaLisa M, PA-C 01/31/19 [id number]    Shon BatonHorton, Courtney F, MD 01/31/19 [phone]

## 2019-01-31 NOTE — ED Provider Notes (Signed)
Received signout the beginning of shift.  Patient presenting for evaluation of neck injury.  Last night he was stepped on by his 55 pounds dog.  Impact was to his throat and he report feeling discomfort in his "Adam's apple".  He is currently awaiting for a neck CT soft tissue for further evaluation  7:39 AM CT scan of the soft tissue neck shows no acute trauma to the neck.  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  I discussed this finding with patient.  He is able to phonate without difficulty.  No significant signs of neck injury were noted.  Able to swallow without difficulty.  Reassurance given.  Rice therapy discussed.  Return precaution discussed.  BP (!) 133/91   Pulse (!) 55   Temp 98.1 F (36.7 C) (Oral)   Resp 20   SpO2 98%   Results for orders placed or performed during the hospital encounter of 01/30/19  CBC with Differential  Result Value Ref Range   WBC 7.5 4.0 - 10.5 K/uL   RBC 4.84 4.22 - 5.81 MIL/uL   Hemoglobin 14.4 13.0 - 17.0 g/dL   HCT 43.5 39.0 - 52.0 %   MCV 89.9 80.0 - 100.0 fL   MCH 29.8 26.0 - 34.0 pg   MCHC 33.1 30.0 - 36.0 g/dL   RDW 12.6 11.5 - 15.5 %   Platelets 296 150 - 400 K/uL   nRBC 0.0 0.0 - 0.2 %   Neutrophils Relative % 60 %   Neutro Abs 4.5 1.7 - 7.7 K/uL   Lymphocytes Relative 30 %   Lymphs Abs 2.2 0.7 - 4.0 K/uL   Monocytes Relative 8 %   Monocytes Absolute 0.6 0.1 - 1.0 K/uL   Eosinophils Relative 1 %   Eosinophils Absolute 0.1 0.0 - 0.5 K/uL   Basophils Relative 1 %   Basophils Absolute 0.0 0.0 - 0.1 K/uL   Immature Granulocytes 0 %   Abs Immature Granulocytes 0.02 0.00 - 0.07 K/uL  I-stat Creatinine, ED  Result Value Ref Range   Creatinine, Ser 1.20 0.61 - 1.24 mg/dL  POCT I-Stat EG7  Result Value Ref Range   pH, Ven 7.354 7.250 - 7.430   pCO2, Ven 54.2 44.0 - 60.0 mmHg   pO2, Ven 33.0 32.0 - 45.0 mmHg   Bicarbonate 30.2 (H) 20.0 - 28.0 mmol/L   TCO2 32 22 - 32 mmol/L   O2  Saturation 59.0 %   Acid-Base Excess 3.0 (H) 0.0 - 2.0 mmol/L   Sodium 140 135 - 145 mmol/L   Potassium 4.2 3.5 - 5.1 mmol/L   Calcium, Ion 1.28 1.15 - 1.40 mmol/L   HCT 47.0 39.0 - 52.0 %   Hemoglobin 16.0 13.0 - 17.0 g/dL   Patient temperature HIDE    Sample type VENOUS    Ct Soft Tissue Neck W Contrast  Result Date: 01/31/2019 CLINICAL DATA:  Trauma to left side of neck. Difficulty swallowing. Dog stepped on left side of neck. Neck pain. Initial encounter. EXAM: CT NECK WITH CONTRAST TECHNIQUE: Multidetector CT imaging of the neck was performed using the standard protocol following the bolus administration of intravenous contrast. CONTRAST:  33mL ISOVUE-300 IOPAMIDOL (ISOVUE-300) INJECTION 61% COMPARISON:  Soft tissue neck radiographs 09/04/2016 FINDINGS: Pharynx and larynx: Mild prominence at the adenoid tissue is likely within normal limits for age. Soft palate is unremarkable. Palatine tonsils are normal. No focal mucosal or submucosal lesions are present. Epiglottis is normal. Vocal cords are  midline and symmetric. Salivary glands: Salivary glands are within normal limits bilaterally. Thyroid: Thyroid is within normal limits. 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 No solid component is present. Lymph nodes: No significant cervical adenopathy is present. Multiple subcentimeter nodes are present bilaterally, likely reactive. Vascular: Negative. Limited intracranial: Unremarkable Visualized orbits: The globes and orbits are within normal limits. Mastoids and visualized paranasal sinuses: Polyps or mucous retention cysts are present along the floor of the maxillary sinuses bilaterally. The paranasal sinuses and mastoid air cells are otherwise clear. Skeleton: Vertebral body heights alignment are maintained. No focal lytic or blastic lesions are present. Mandible is intact and located Upper chest: The lung apices are clear.   Thoracic inlet is normal. IMPRESSION: 1. No acute trauma to the neck. Hyoid bone and tracheal cartilage are intact. 2. 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 Electronically Signed   By: Marin Robertshristopher  Mattern M.D.   On: 01/31/2019 07:20      Fayrene Helperran, Kymberlyn Eckford, PA-C 01/31/19 [id number]    Azalia Bilisampos, Kevin, MD 01/31/19 [phone]

## 2019-10-06 ENCOUNTER — Ambulatory Visit: Payer: BC Managed Care – PPO | Attending: Internal Medicine

## 2019-10-06 DIAGNOSIS — Z23 Encounter for immunization: Secondary | ICD-10-CM

## 2019-10-06 NOTE — Progress Notes (Signed)
   Covid-19 Vaccination Clinic  Name:  Gabriel Nguyen    MRN: [id number] DOB: May 19, 1991  10/06/2019  Gabriel Nguyen was observed post Covid-19 immunization for 15 minutes without incident. He was provided with Vaccine Information Sheet and instruction to access the V-Safe system.   Gabriel Nguyen was instructed to call 911 with any severe reactions post vaccine: Marland Kitchen Difficulty breathing  . Swelling of face and throat  . A fast heartbeat  . A bad rash all over body  . Dizziness and weakness   Immunizations Administered    Name Date Dose VIS Date Route   Moderna COVID-19 Vaccine 10/06/2019 12:04 PM 0.5 mL 06/05/2019 Intramuscular   Manufacturer: Moderna   Lot: [id number]   NDC: 40459-136-85

## 2019-11-03 ENCOUNTER — Ambulatory Visit: Payer: BC Managed Care – PPO | Attending: Critical Care Medicine

## 2019-11-03 DIAGNOSIS — Z23 Encounter for immunization: Secondary | ICD-10-CM

## 2019-11-03 NOTE — Progress Notes (Signed)
   Covid-19 Vaccination Clinic  Name:  Gabriel Nguyen    MRN: [id number] DOB: [birth year]  11/03/2019  [name] was observed post Covid-19 immunization for 15 minutes without incident. He was provided with Vaccine Information Sheet and instruction to access the V-Safe system.   [name] was instructed to call 911 with any severe reactions post vaccine: Marland Kitchen Difficulty breathing  . Swelling of face and throat  . A fast heartbeat  . A bad rash all over body  . Dizziness and weakness   Immunizations Administered    Name Date Dose VIS Date Route   Moderna COVID-19 Vaccine 11/03/2019 12:07 PM 0.5 mL 06/2019 Intramuscular   Manufacturer: Moderna   Lot: [id number]   NDC: 37342-876-81

## 2019-12-11 ENCOUNTER — Ambulatory Visit (HOSPITAL_COMMUNITY)
Admission: EM | Admit: 2019-12-11 | Discharge: 2019-12-11 | Disposition: A | Discharge status: Home / Self Care | Payer: BC Managed Care – PPO | Attending: Family Medicine | Admitting: Family Medicine

## 2019-12-11 ENCOUNTER — Encounter (HOSPITAL_COMMUNITY): Payer: Self-pay

## 2019-12-11 ENCOUNTER — Other Ambulatory Visit: Payer: Self-pay

## 2019-12-11 DIAGNOSIS — Z20822 Contact with and (suspected) exposure to covid-19: Secondary | ICD-10-CM | POA: Insufficient documentation

## 2019-12-11 DIAGNOSIS — J069 Acute upper respiratory infection, unspecified: Secondary | ICD-10-CM | POA: Insufficient documentation

## 2019-12-11 LAB — SARS CORONAVIRUS 2 (TAT 6-24 HRS): SARS Coronavirus 2: NEGATIVE

## 2019-12-11 MED ORDER — BENZONATATE 100 MG PO CAPS
100.0000 mg | ORAL_CAPSULE | Freq: Three times a day (TID) | ORAL | 0 refills | Status: DC
Start: 2019-12-11 — End: 2023-10-17

## 2019-12-11 MED ORDER — FLUTICASONE PROPIONATE 50 MCG/ACT NA SUSP
1.0000 | Freq: Every day | NASAL | 0 refills | Status: DC
Start: 2019-12-11 — End: 2023-10-17

## 2019-12-11 MED ORDER — CEPACOL SORE THROAT 5.4 MG MT LOZG: 1.0000 | LOZENGE | OROMUCOSAL | 0 refills | Status: DC | PRN

## 2019-12-11 NOTE — ED Provider Notes (Signed)
[facility]    CSN: [id number] Arrival date & time: 12/11/19  1353      History   Chief Complaint Chief Complaint  Patient presents with  . Cough    HPI Gabriel Nguyen is a 29 y.o. male.   Patient presents with cold symptoms for the last 4 days.  He reports started with a sore throat progressed to nasal congestion and a dry cough.  He reports the cough is the primary concern today and the nasal congestion.  Sore throat is resolved mostly.  Reports the cough is dry.  He has had nasal congestion and postnasal drip.  No fevers or chills.  Denies headache.  Denies abdominal pain, nausea or vomiting.  No change in taste or smell.  Patient reports he completed his Covid vaccines.  He reports overall he does not feel that poorly.     Past Medical History:  Diagnosis Date  . Anxiety    pt. 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    There are no problems to display for this patient.   Past Surgical History:  Procedure Laterality Date  . CLOSED REDUCTION MANDIBLE  04/17/2012   Procedure: CLOSED REDUCTION MANDIBULAR;  Surgeon: Melvenia Beam, MD;  Location: [facility] OR;  Service: ENT;  Laterality: N/A;  . LACERATION REPAIR  04/13/2012   Procedure: REPAIR MULTIPLE LACERATIONS;  Surgeon: Melvenia Beam, MD;  Location: [facility] OR;  Service: ENT;;  Repair of chin laceration  . MANDIBULAR HARDWARE REMOVAL  05/26/2012   Procedure: MANDIBULAR HARDWARE REMOVAL;  Surgeon: Melvenia Beam, MD;  Location: [facility] OR;  Service: ENT;  Laterality: N/A;  . ORIF MANDIBULAR FRACTURE  04/13/2012   Procedure: OPEN REDUCTION INTERNAL FIXATION (ORIF) MANDIBULAR FRACTURE;  Surgeon: Melvenia Beam, MD;  Location: [facility] OR;  Service: ENT;  Laterality: N/A;  . TRACHEOSTOMY CLOSURE  05/26/2012   Procedure: TRACHEOSTOMY CLOSURE;  Surgeon: Melvenia Beam, MD;  Location: [facility] OR;  Service: ENT;  Laterality: N/A;  . TRACHEOSTOMY TUBE PLACEMENT  04/14/2012    Procedure: TRACHEOSTOMY;  Surgeon: Melvenia Beam, MD;  Location: [facility] OR;  Service: ENT;  Laterality: N/A;       Home Medications    Prior to Admission medications   Medication Sig Start Date End Date Taking? Authorizing Provider  benzonatate (TESSALON) 100 MG capsule Take 1 capsule (100 mg total) by mouth every 8 (eight) hours. 12/11/19   Emilynn Srinivasan, Veryl Speak, PA-C  fluticasone (FLONASE) 50 MCG/ACT nasal spray Place 1 spray into both nostrils daily. 12/11/19   Hussain Maimone, Veryl Speak, PA-C  Menthol (CEPACOL SORE THROAT) 5.4 MG LOZG Use as directed 1 lozenge (5.4 mg total) in the mouth or throat every 2 (two) hours as needed. 12/11/19   Mariyam Remington, Veryl Speak, PA-C    Family History History reviewed. No pertinent family history.  Social History Social History   Tobacco Use  . Smoking status: Former Smoker    Packs/day: 0.50    Types: Cigarettes    Quit date: 04/13/2012    Years since quitting: 7.6  . Smokeless tobacco: Never Used  Substance Use Topics  . Alcohol use: Yes    Comment: once a month- beer, rare hard liquor  . Drug use: No     Allergies   Patient has no known allergies.   Review of Systems Review of Systems   Physical Exam Triage Vital Signs ED Triage Vitals  Enc Vitals Group     BP  12/11/19 1515 135/81     Pulse Rate 12/11/19 1515 70     Resp 12/11/19 1515 18     Temp 12/11/19 1515 98.1 F (36.7 C)     Temp Source 12/11/19 1515 Oral     SpO2 12/11/19 1515 100 %     Weight --      Height --      Head Circumference --      Peak Flow --      Pain Score 12/11/19 1518 0     Pain Loc --      Pain Edu? --      Excl. in [place]? --    No data found.  Updated Vital Signs BP 135/81 (BP Location: Right Arm)   Pulse 70   Temp 98.1 F (36.7 C) (Oral)   Resp 18   SpO2 100%   Visual Acuity Right Eye Distance:   Left Eye Distance:   Bilateral Distance:    Right Eye Near:   Left Eye Near:    Bilateral Near:     Physical Exam Vitals and nursing note reviewed.  Constitutional:       General: He is not in acute distress.    Appearance: He is well-developed. He is not ill-appearing.  HENT:     Head: Normocephalic and atraumatic.     Right Ear: Tympanic membrane normal.     Left Ear: Tympanic membrane normal.     Nose: Congestion and rhinorrhea present.     Mouth/Throat:     Mouth: Mucous membranes are moist.     Comments: Mild postnasal drip Eyes:     Extraocular Movements: Extraocular movements intact.     Conjunctiva/sclera: Conjunctivae normal.     Pupils: Pupils are equal, round, and reactive to light.  Cardiovascular:     Rate and Rhythm: Normal rate and regular rhythm.     Heart sounds: No murmur.  Pulmonary:     Effort: Pulmonary effort is normal. No respiratory distress.     Breath sounds: Normal breath sounds.  Abdominal:     Palpations: Abdomen is soft.     Tenderness: There is no abdominal tenderness.  Musculoskeletal:     Cervical back: Neck supple.  Skin:    General: Skin is warm and dry.  Neurological:     Mental Status: He is alert.      UC Treatments / Results  Labs (all labs ordered are listed, but only abnormal results are displayed) Labs Reviewed  SARS CORONAVIRUS 2 (TAT 6-24 HRS)    EKG   Radiology No results found.  Procedures Procedures (including critical care time)  Medications Ordered in UC Medications - No data to display  Initial Impression / Assessment and Plan / UC Course  I have reviewed the triage vital signs and the nursing notes.  Pertinent labs & imaging results that were available during my care of the patient were reviewed by me and considered in my medical decision making (see chart for details).     #Viral URI with cough Patient is a 29 year old presenting with viral URI.  We will treat symptomatically with Tessalon for cough, Flonase for nasal congestion, Cepacol and Tylenol for sore throat.  Discussed expectations of improvements of viral illness.  Will send Covid PCR.  Return and follow-up  precautions were discussed.  Patient verbalized understanding. Final Clinical Impressions(s) / UC Diagnoses   Final diagnoses:  Viral URI with cough     Discharge Instructions     Take  the Tessalon for cough as prescribed Use the Flonase daily for nasal congestion Use the Cepacol drops for sore throat You may also take Tylenol or ibuprofen over-the-counter for sore throat  I expect you will start to feel better over the next 3 to 4 days.  Please return if acutely worsening or not improving after 10 days.  Establish care with the family medicine center for primary care  If your Covid-19 test is positive, you will receive a phone call from [facility] regarding your results. Negative test results are not called. Both positive and negative results area always visible on MyChart. If you do not have a MyChart account, sign up instructions are in your discharge papers.   Persons who are directed to care for themselves at home may discontinue isolation under the following conditions:  . At least 10 days have passed since symptom onset and . At least 24 hours have passed without running a fever (this means without the use of fever-reducing medications) and . Other symptoms have improved.  Persons infected with COVID-19 who never develop symptoms may discontinue isolation and other precautions 10 days after the date of their first positive COVID-19 test.     ED Prescriptions    Medication Sig Dispense Auth. Provider   fluticasone (FLONASE) 50 MCG/ACT nasal spray Place 1 spray into both nostrils daily. 11.1 mL Marabella Popiel, Veryl Speak, PA-C   Menthol (CEPACOL SORE THROAT) 5.4 MG LOZG Use as directed 1 lozenge (5.4 mg total) in the mouth or throat every 2 (two) hours as needed. 30 lozenge Tykira Wachs, Veryl Speak, PA-C   benzonatate (TESSALON) 100 MG capsule Take 1 capsule (100 mg total) by mouth every 8 (eight) hours. 21 capsule Taura Lamarre, Veryl Speak, PA-C     PDMP not reviewed this encounter.   Hermelinda Medicus,  PA-C 12/12/19 0000

## 2019-12-11 NOTE — ED Triage Notes (Signed)
Pt presents to UC with sore throat, runny nose, and productive cough x4 days. Pt endorses low energy. Pt denies seasonal allergies.

## 2019-12-11 NOTE — Discharge Instructions (Signed)
Take the Tessalon for cough as prescribed Use the Flonase daily for nasal congestion Use the Cepacol drops for sore throat You may also take Tylenol or ibuprofen over-the-counter for sore throat  I expect you will start to feel better over the next 3 to 4 days.  Please return if acutely worsening or not improving after 10 days.  Establish care with the family medicine center for primary care  If your Covid-19 test is positive, you will receive a phone call from [facility] regarding your results. Negative test results are not called. Both positive and negative results area always visible on MyChart. If you do not have a MyChart account, sign up instructions are in your discharge papers.   Persons who are directed to care for themselves at home may discontinue isolation under the following conditions:   At least 10 days have passed since symptom onset and  At least 24 hours have passed without running a fever (this means without the use of fever-reducing medications) and  Other symptoms have improved.  Persons infected with COVID-19 who never develop symptoms may discontinue isolation and other precautions 10 days after the date of their first positive COVID-19 test.

## 2021-06-27 ENCOUNTER — Encounter (HOSPITAL_BASED_OUTPATIENT_CLINIC_OR_DEPARTMENT_OTHER): Payer: Self-pay

## 2021-06-27 ENCOUNTER — Other Ambulatory Visit: Payer: Self-pay

## 2021-06-27 ENCOUNTER — Emergency Department (HOSPITAL_BASED_OUTPATIENT_CLINIC_OR_DEPARTMENT_OTHER)
Admission: EM | Admit: 2021-06-27 | Discharge: 2021-06-28 | Disposition: A | Discharge status: Home / Self Care | Payer: 59 | Attending: Emergency Medicine | Admitting: Emergency Medicine

## 2021-06-27 DIAGNOSIS — Z87891 Personal history of nicotine dependence: Secondary | ICD-10-CM | POA: Diagnosis not present

## 2021-06-27 DIAGNOSIS — Z23 Encounter for immunization: Secondary | ICD-10-CM | POA: Diagnosis not present

## 2021-06-27 DIAGNOSIS — S0185XA Open bite of other part of head, initial encounter: Secondary | ICD-10-CM | POA: Diagnosis not present

## 2021-06-27 DIAGNOSIS — S0990XA Unspecified injury of head, initial encounter: Secondary | ICD-10-CM | POA: Diagnosis present

## 2021-06-27 DIAGNOSIS — W540XXA Bitten by dog, initial encounter: Secondary | ICD-10-CM | POA: Insufficient documentation

## 2021-06-27 MED ORDER — AMOXICILLIN-POT CLAVULANATE 875-125 MG PO TABS: 1.0000 | ORAL_TABLET | Freq: Two times a day (BID) | ORAL | 0 refills | Status: AC

## 2021-06-27 MED ORDER — TETANUS-DIPHTH-ACELL PERTUSSIS 5-2.5-18.5 LF-MCG/0.5 IM SUSY
0.5000 mL | PREFILLED_SYRINGE | Freq: Once | INTRAMUSCULAR | Status: AC
  Administered 2021-06-28: 0.5 mL via INTRAMUSCULAR
  Filled 2021-06-27: qty 0.5

## 2021-06-27 NOTE — ED Triage Notes (Signed)
Pt was playing with dog and his tooth cut right cheek near mouth. States dog up to date on vaccines.

## 2021-06-27 NOTE — ED Provider Notes (Signed)
[facility] EMERGENCY DEPARTMENT Provider Note  CSN: [id number] Arrival date & time: 06/27/21 2030  Chief Complaint(s) Facial Laceration (Dog bite)  HPI Gabriel Nguyen Nguyen is a 30 y.o. male here for facial laceration to face.  He reports that while playing with his dog, the dog's teeth got his face as he was pulling away.  This occurred 2 to 3 hours prior to arrival.  The dog is up-to-date on all other vaccinations.  The patient's tetanus status is not up-to-date.  He reports that this was accidental and not due to aggression.  Denies any associated pain.  He did not irrigate the wound.  Denies any other physical complaints.  The history is provided by the patient.   Past Medical History Past Medical History:  Diagnosis Date   Anxiety    pt. 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   There are no problems to display for this patient.  Home Medication(s) Prior to Admission medications   Medication Sig Start Date End Date Taking? Authorizing Provider  amoxicillin-clavulanate (AUGMENTIN) 875-125 MG tablet Take 1 tablet by mouth every 12 (twelve) hours for 7 days. 06/27/21 07/04/21 Yes Veronda Gabor, Amadeo Garnet, MD  benzonatate (TESSALON) 100 MG capsule Take 1 capsule (100 mg total) by mouth every 8 (eight) hours. 12/11/19   Darr, Gerilyn Pilgrim, PA-C  fluticasone (FLONASE) 50 MCG/ACT nasal spray Place 1 spray into both nostrils daily. 12/11/19   Darr, Gerilyn Pilgrim, PA-C  Menthol (CEPACOL SORE THROAT) 5.4 MG LOZG Use as directed 1 lozenge (5.4 mg total) in the mouth or throat every 2 (two) hours as needed. 12/11/19   Darr, Gerilyn Pilgrim, PA-C                                                                                                                                    Past Surgical History Past Surgical History:  Procedure Laterality Date   CLOSED REDUCTION MANDIBLE  04/17/2012   Procedure: CLOSED REDUCTION MANDIBULAR;  Surgeon: Melvenia Beam, MD;  Location: [facility] OR;  Service: ENT;  Laterality: N/A;   LACERATION REPAIR  04/13/2012   Procedure: REPAIR MULTIPLE LACERATIONS;  Surgeon: Melvenia Beam, MD;  Location: [facility] OR;  Service: ENT;;  Repair of chin laceration   MANDIBULAR HARDWARE REMOVAL  05/26/2012   Procedure: MANDIBULAR HARDWARE REMOVAL;  Surgeon: Melvenia Beam, MD;  Location: [facility] OR;  Service: ENT;  Laterality: N/A;   ORIF MANDIBULAR FRACTURE  04/13/2012   Procedure: OPEN REDUCTION INTERNAL FIXATION (ORIF) MANDIBULAR FRACTURE;  Surgeon: Melvenia Beam, MD;  Location: [facility] OR;  Service: ENT;  Laterality: N/A;   TRACHEOSTOMY CLOSURE  05/26/2012   Procedure: TRACHEOSTOMY CLOSURE;  Surgeon: Melvenia Beam, MD;  Location: [facility] OR;  Service: ENT;  Laterality: N/A;   TRACHEOSTOMY TUBE PLACEMENT  04/14/2012   Procedure: TRACHEOSTOMY;  Surgeon: Melvenia Beam, MD;  Location: [facility] OR;  Service: ENT;  Laterality: N/A;   Family History History reviewed. No pertinent family history.  Social History Social History   Tobacco Use   Smoking status: Former    Packs/day: 0.50    Types: Cigarettes    Quit date: 04/13/2012    Years since quitting: 9.2   Smokeless tobacco: Never  Substance Use Topics   Alcohol use: Yes    Comment: once a month- beer, rare hard liquor   Drug use: No   Allergies Patient has no known allergies.  Review of Systems Review of Systems All other systems are reviewed and are negative for acute change except as noted in the HPI  Physical Exam Vital Signs  I have reviewed the triage vital signs BP (!) 142/86 (BP Location: Left Arm)    Pulse 84    Temp 98.7 F (37.1 C) (Oral)    Resp 18    Ht 6\' 6"  (1.981 m)    Wt 96.6 kg    SpO2 100%    BMI 24.61 kg/m   Physical Exam Vitals reviewed.  Constitutional:      General: He is not in acute distress.    Appearance: He is well-developed. He is not diaphoretic.  HENT:     Head: Normocephalic. Laceration present.      Right Ear: External ear normal.     Left Ear:  External ear normal.     Nose: Nose normal.     Mouth/Throat:     Mouth: Mucous membranes are moist.  Eyes:     General: No scleral icterus.    Conjunctiva/sclera: Conjunctivae normal.  Neck:     Trachea: Phonation normal.  Cardiovascular:     Rate and Rhythm: Normal rate and regular rhythm.  Pulmonary:     Effort: Pulmonary effort is normal. No respiratory distress.     Breath sounds: No stridor.  Abdominal:     General: There is no distension.  Musculoskeletal:        General: Normal range of motion.     Cervical back: Normal range of motion.  Neurological:     Mental Status: He is alert and oriented to person, place, and time.  Psychiatric:        Behavior: Behavior normal.    ED Results and Treatments Labs (all labs ordered are listed, but only abnormal results are displayed) Labs Reviewed - No data to display                                                                                                                       EKG  EKG Interpretation  Date/Time:    Ventricular Rate:    PR Interval:    QRS Duration:   QT Interval:    QTC Calculation:   R Axis:     Text Interpretation:         Radiology No results found.  Pertinent labs & imaging results that were available during my care of the patient were reviewed by me and considered  in my medical decision making (see MDM for details).  Medications Ordered in ED Medications  Tdap (BOOSTRIX) injection 0.5 mL (0.5 mLs Intramuscular Given 06/28/21 0001)                                                                                                                                     Procedures Procedures  (including critical care time)  Medical Decision Making / ED Course I have reviewed the nursing notes for this encounter and the patient's prior records (if available in EHR or on provided paperwork).  Gabriel Nguyen was evaluated in Emergency Department on 06/28/2021 for the symptoms described in the  history of present illness. He was evaluated in the context of the global COVID-19 pandemic, which necessitated consideration that the patient might be at risk for infection with the SARS-CoV-2 virus that causes COVID-19. Institutional protocols and algorithms that pertain to the evaluation of patients at risk for COVID-19 are in a state of rapid change based on information released by regulatory bodies including the CDC and federal and state organizations. These policies and algorithms were followed during the patient's care in the ED.     Dog bite to face. Relatively superficial. Thoroughly irrigated. Will allow for secondary closure. Tdap updated. Ppx Augment Rx given.    Final Clinical Impression(s) / ED Diagnoses Final diagnoses:  Dog bite of face, initial encounter   The patient appears reasonably screened and/or stabilized for discharge and I doubt any other medical condition or other [facility] requiring further screening, evaluation, or treatment in the ED at this time prior to discharge. Safe for discharge with strict return precautions.  Disposition: Discharge  Condition: Good  I have discussed the results, Dx and Tx plan with the patient/family who expressed understanding and agree(s) with the plan. Discharge instructions discussed at length. The patient/family was given strict return precautions who verbalized understanding of the instructions. No further questions at time of discharge.    ED Discharge Orders          Ordered    amoxicillin-clavulanate (AUGMENTIN) 875-125 MG tablet  Every 12 hours        06/27/21 2353             Follow Up: Primary care provider  Call  to schedule an appointment for close follow up     This chart was dictated using voice recognition software.  Despite best efforts to proofread,  errors can occur which can change the documentation meaning.    Nira Conn, MD 06/28/21 [phone]

## 2021-06-27 NOTE — ED Notes (Signed)
ED Provider at bedside. 

## 2023-10-16 ENCOUNTER — Other Ambulatory Visit (HOSPITAL_COMMUNITY)

## 2023-10-16 ENCOUNTER — Other Ambulatory Visit: Payer: Self-pay

## 2023-10-16 ENCOUNTER — Encounter (HOSPITAL_COMMUNITY): Payer: Self-pay | Admitting: Emergency Medicine

## 2023-10-16 ENCOUNTER — Emergency Department (HOSPITAL_COMMUNITY)
Admission: EM | Admit: 2023-10-16 | Discharge: 2023-10-17 | Disposition: A | Discharge status: Home / Self Care | Attending: Emergency Medicine | Admitting: Emergency Medicine

## 2023-10-16 DIAGNOSIS — N50819 Testicular pain, unspecified: Secondary | ICD-10-CM

## 2023-10-16 DIAGNOSIS — N50811 Right testicular pain: Secondary | ICD-10-CM | POA: Insufficient documentation

## 2023-10-16 DIAGNOSIS — N50812 Left testicular pain: Secondary | ICD-10-CM | POA: Diagnosis not present

## 2023-10-16 LAB — URINALYSIS, ROUTINE W REFLEX MICROSCOPIC
Bilirubin Urine: NEGATIVE
Glucose, UA: NEGATIVE mg/dL
Hgb urine dipstick: NEGATIVE
Ketones, ur: 5 mg/dL — AB
Leukocytes,Ua: NEGATIVE
Nitrite: NEGATIVE
Protein, ur: 30 mg/dL — AB
Specific Gravity, Urine: 1.031 — ABNORMAL HIGH (ref 1.005–1.030)
pH: 5 (ref 5.0–8.0)

## 2023-10-16 MED ORDER — OXYCODONE-ACETAMINOPHEN 5-325 MG PO TABS
2.0000 | ORAL_TABLET | Freq: Once | ORAL | Status: AC
  Administered 2023-10-16: 2 via ORAL
  Filled 2023-10-16: qty 2

## 2023-10-16 NOTE — ED Provider Triage Note (Signed)
  Emergency Medicine Provider Triage Evaluation Note  MRN:  [id number]  Arrival date & time: 10/16/23    Medically screening exam initiated at 11:01 PM.   CC:   Testicle Pain and Abdominal Pain   HPI:  Gabriel Nguyen is a 33 y.o. year-old male presents to the ED with chief complaint of testicle pain for the past 45 minutes.  States it feels heavy.  States that it feels like when you get kicked.  Denies penile discharge.  Denies any dysuria.  History provided by patient ROS:  -As included in HPI PE:   Vitals:   10/16/23 2259  BP: 135/83  Pulse: (!) 107  Resp: 17  Temp: 98.2 F (36.8 C)  SpO2: 96%    Non-toxic appearing No respiratory distress No obvious masses, lesions, or discharge  MDM:  Based on signs and symptoms, torsion is highest on my differential, followed by STI. I've ordered imaging and labs in triage to expedite lab/diagnostic workup.  Patient was informed that the remainder of the evaluation will be completed by another provider, this initial triage assessment does not replace that evaluation, and the importance of remaining in the ED until their evaluation is complete.    Sherel Dikes, PA-C 10/16/23 2303

## 2023-10-16 NOTE — ED Triage Notes (Signed)
 Patient c/o bilateral testicular pain radiating into abdomen that started an hour ago.  Patient reports "I think I have a hernia."

## 2023-10-17 ENCOUNTER — Emergency Department (HOSPITAL_COMMUNITY)

## 2023-10-17 LAB — GC/CHLAMYDIA PROBE AMP (~~LOC~~) NOT AT ARMC
Chlamydia: NEGATIVE
Comment: NEGATIVE
Comment: NORMAL
Neisseria Gonorrhea: NEGATIVE

## 2023-10-17 LAB — HIV ANTIBODY (ROUTINE TESTING W REFLEX): HIV Screen 4th Generation wRfx: NONREACTIVE

## 2023-10-17 MED ORDER — DOXYCYCLINE HYCLATE 100 MG PO TABS
100.0000 mg | ORAL_TABLET | Freq: Once | ORAL | Status: AC
  Administered 2023-10-17: 100 mg via ORAL
  Filled 2023-10-17: qty 1

## 2023-10-17 MED ORDER — KETOROLAC TROMETHAMINE 60 MG/2ML IM SOLN
30.0000 mg | Freq: Once | INTRAMUSCULAR | Status: AC
  Administered 2023-10-17: 30 mg via INTRAMUSCULAR
  Filled 2023-10-17: qty 2

## 2023-10-17 MED ORDER — DOXYCYCLINE HYCLATE 100 MG PO CAPS: 100.0000 mg | ORAL_CAPSULE | Freq: Two times a day (BID) | ORAL | 0 refills | Status: AC

## 2023-10-17 NOTE — ED Notes (Signed)
 This RN spoke with ultrasound at this time who stated they would be sending someone to retrieve the patient soon.

## 2023-10-17 NOTE — ED Provider Notes (Signed)
 [place] EMERGENCY DEPARTMENT AT [facility] Provider Note   CSN: [id number] Arrival date & time: 10/16/23  2241     History  Chief Complaint  Patient presents with   Testicle Pain   Abdominal Pain    Gabriel Nguyen is a 33 y.o. male.  The history is provided by the patient.  Testicle Pain This is a new problem. The current episode started 1 to 2 hours ago. The problem occurs constantly. The problem has been gradually improving. Pertinent negatives include no headaches. Nothing aggravates the symptoms. Nothing relieves the symptoms. He has tried nothing for the symptoms.  Patient had sudden onset B testicle pain and patient felt testicles were swollen.  Patient states he is celibate but does leak sperm with urination.       Home Medications Prior to Admission medications   Medication Sig Start Date End Date Taking? Authorizing Provider  doxycycline (VIBRAMYCIN) 100 MG capsule Take 1 capsule (100 mg total) by mouth 2 (two) times daily. One po bid x 7 days 10/17/23  Yes Georgianne Gritz, MD      Allergies    Patient has no known allergies.    Review of Systems   Review of Systems  Constitutional:  Negative for fever.  Genitourinary:  Positive for testicular pain. Negative for dysuria, flank pain, frequency, genital sores and hematuria.  Neurological:  Negative for headaches.  All other systems reviewed and are negative.   Physical Exam Updated Vital Signs BP 133/84   Pulse 72   Temp 98.2 F (36.8 C) (Oral)   Resp 17   Wt 99.8 kg   SpO2 100%   BMI 25.42 kg/m  Physical Exam Vitals and nursing note reviewed. Exam conducted with a chaperone present.  Constitutional:      General: He is not in acute distress.    Appearance: He is well-developed. He is not diaphoretic.  HENT:     Head: Normocephalic and atraumatic.     Nose: Nose normal.  Eyes:     Conjunctiva/sclera: Conjunctivae normal.     Pupils: Pupils are equal, round, and reactive to light.   Cardiovascular:     Rate and Rhythm: Normal rate and regular rhythm.     Pulses: Normal pulses.     Heart sounds: Normal heart sounds.  Pulmonary:     Effort: Pulmonary effort is normal.     Breath sounds: Normal breath sounds. No wheezing or rales.  Abdominal:     General: Bowel sounds are normal.     Palpations: Abdomen is soft.     Tenderness: There is no abdominal tenderness. There is no guarding or rebound.  Genitourinary:    Penis: Normal.      Testes: Normal. Cremasteric reflex is present.        Right: Mass, tenderness or swelling not present.        Left: Mass, tenderness or swelling not present.  Musculoskeletal:        General: Normal range of motion.     Cervical back: Normal range of motion and neck supple.  Skin:    General: Skin is warm and dry.     Capillary Refill: Capillary refill takes less than 2 seconds.  Neurological:     General: No focal deficit present.     Mental Status: He is alert and oriented to person, place, and time.     Deep Tendon Reflexes: Reflexes normal.  Psychiatric:        Mood and  Affect: Mood normal.     ED Results / Procedures / Treatments   Labs (all labs ordered are listed, but only abnormal results are displayed) Labs Reviewed  URINALYSIS, ROUTINE W REFLEX MICROSCOPIC - Abnormal; Notable for the following components:      Result Value   APPearance HAZY (*)    Specific Gravity, Urine 1.031 (*)    Ketones, ur 5 (*)    Protein, ur 30 (*)    Bacteria, UA FEW (*)    All other components within normal limits  URINE CULTURE  HIV ANTIBODY (ROUTINE TESTING W REFLEX)  GC/CHLAMYDIA PROBE AMP ([place]) NOT AT [facility]    EKG None  Radiology US SCROTUM W/DOPPLER Result Date: 10/17/2023 CLINICAL DATA:  Bilateral testicular pain EXAM: SCROTAL ULTRASOUND DOPPLER ULTRASOUND OF THE TESTICLES TECHNIQUE: Complete ultrasound examination of the testicles, epididymis, and other scrotal structures was performed. Color and spectral Doppler  ultrasound were also utilized to evaluate blood flow to the testicles. COMPARISON:  None Available. FINDINGS: Right testicle Measurements: 4.6 x 3.2 x 2.8 cm. No mass or microlithiasis visualized. Left testicle Measurements: 4.5 x 2.6 x 2.8 cm. No mass or microlithiasis visualized. Right epididymis:  Normal in size and appearance. Left epididymis:  Normal in size and appearance. Hydrocele:  None visualized. Varicocele:  None visualized. Pulsed Doppler interrogation of both testes demonstrates normal low resistance arterial and venous waveforms bilaterally. IMPRESSION: Normal-appearing testicles bilaterally. Electronically Signed   By: Alcide Clever M.D.   On: 10/17/2023 01:20    Procedures Procedures    Medications Ordered in ED Medications  oxyCODONE-acetaminophen (PERCOCET/ROXICET) 5-325 MG per tablet 2 tablet (2 tablets Oral Given 10/16/23 2314)  ketorolac (TORADOL) injection 30 mg (30 mg Intramuscular Given 10/17/23 0222)  doxycycline (VIBRA-TABS) tablet 100 mg (100 mg Oral Given 10/17/23 0222)    ED Course/ Medical Decision Making/ A&P                                 Medical Decision Making Testicular pain sudden onset.  No trauma   Amount and/or Complexity of Data Reviewed External Data Reviewed: notes.    Details: Previous notes reviewed  Labs: ordered.    Details: Urine with a few bacteria.  Will treat.   Radiology: ordered and independent interpretation performed.    Details: Blood flow to both testicles   Risk Prescription drug management. Risk Details: Well appearing.  Differential is intermittent torsion.  UTI or latent STI.  Will treat with one week of doxycyline and have patient follow up with urology as an outpatient.  Stable for discharge with close follow up.      Final Clinical Impression(s) / ED Diagnoses Final diagnoses:  Pain in testicle, unspecified laterality   No signs of systemic illness or infection. The patient is nontoxic-appearing on exam and vital  signs are within normal limits.  I have reviewed the triage vital signs and the nursing notes. Pertinent labs & imaging results that were available during my care of the patient were reviewed by me and considered in my medical decision making (see chart for details). After history, exam, and medical workup I feel the patient has been appropriately medically screened and is safe for discharge home. Pertinent diagnoses were discussed with the patient. Patient was given return precautions.    Rx / DC Orders ED Discharge Orders          Ordered    doxycycline (VIBRAMYCIN) 100  MG capsule  2 times daily        10/17/23 0242              Emet Rafanan, MD 10/17/23 [phone]

## 2023-10-18 LAB — URINE CULTURE: Culture: <10000 — AB

## 2023-12-09 ENCOUNTER — Emergency Department (HOSPITAL_BASED_OUTPATIENT_CLINIC_OR_DEPARTMENT_OTHER)
Admission: EM | Admit: 2023-12-09 | Discharge: 2023-12-09 | Disposition: A | Discharge status: Home / Self Care | Attending: Emergency Medicine | Admitting: Emergency Medicine

## 2023-12-09 ENCOUNTER — Emergency Department (HOSPITAL_BASED_OUTPATIENT_CLINIC_OR_DEPARTMENT_OTHER)

## 2023-12-09 ENCOUNTER — Encounter (HOSPITAL_BASED_OUTPATIENT_CLINIC_OR_DEPARTMENT_OTHER): Payer: Self-pay | Admitting: Emergency Medicine

## 2023-12-09 DIAGNOSIS — R202 Paresthesia of skin: Secondary | ICD-10-CM

## 2023-12-09 DIAGNOSIS — R200 Anesthesia of skin: Secondary | ICD-10-CM | POA: Diagnosis present

## 2023-12-09 DIAGNOSIS — Z87891 Personal history of nicotine dependence: Secondary | ICD-10-CM | POA: Diagnosis not present

## 2023-12-09 LAB — BASIC METABOLIC PANEL WITH GFR
Anion gap: 13 (ref 5–15)
BUN: 13 mg/dL (ref 6–20)
CO2: 22 mmol/L (ref 22–32)
Calcium: 9.6 mg/dL (ref 8.9–10.3)
Chloride: 104 mmol/L (ref 98–111)
Creatinine, Ser: 1.15 mg/dL (ref 0.61–1.24)
GFR, Estimated: >60 mL/min (ref >60)
Glucose, Bld: 88 mg/dL (ref 70–99)
Potassium: 4 mmol/L (ref 3.5–5.1)
Sodium: 140 mmol/L (ref 135–145)

## 2023-12-09 LAB — CBC
HCT: 38.4 % — ABNORMAL LOW (ref 39.0–52.0)
Hemoglobin: 12.9 g/dL — ABNORMAL LOW (ref 13.0–17.0)
MCH: 30.1 pg (ref 26.0–34.0)
MCHC: 33.6 g/dL (ref 30.0–36.0)
MCV: 89.5 fL (ref 80.0–100.0)
Platelets: 347 10*3/uL (ref 150–400)
RBC: 4.29 MIL/uL (ref 4.22–5.81)
RDW: 12.6 % (ref 11.5–15.5)
WBC: 5.6 10*3/uL (ref 4.0–10.5)
nRBC: 0 % (ref 0.0–0.2)

## 2023-12-09 LAB — CBG MONITORING, ED: Glucose-Capillary: 83 mg/dL (ref 70–99)

## 2023-12-09 MED ORDER — CYCLOBENZAPRINE HCL 10 MG PO TABS
10.0000 mg | ORAL_TABLET | Freq: Two times a day (BID) | ORAL | 0 refills | Status: AC | PRN
Start: 2023-12-09 — End: ?

## 2023-12-09 MED ORDER — PREDNISONE 10 MG (21) PO TBPK: ORAL_TABLET | Freq: Every day | ORAL | 0 refills | Status: AC

## 2023-12-09 MED ORDER — PREDNISONE 50 MG PO TABS
60.0000 mg | ORAL_TABLET | Freq: Once | ORAL | Status: AC
  Administered 2023-12-09: 60 mg via ORAL
  Filled 2023-12-09: qty 1

## 2023-12-09 NOTE — ED Provider Notes (Signed)
 [place] EMERGENCY DEPARTMENT AT [facility] Provider Note   CSN: [id number] Arrival date & time: 12/09/23  1115     History  Chief Complaint  Patient presents with   Numbness    Gabriel Nguyen is a 33 y.o. male.  HPI   33 year old male presents to the emergency department with complaints of left arm numbness.  States he has been having this for about the past week and a half or so.  Tends to worsen in certain positions he is sleeping and as well as when he initially wakes up.  States that his symptoms do not resolve whenever he moves his arm.  Over the past couple of days, has had more constant symptoms after awakening.  Denies any real weakness in the arm.  Reports tingling sensation in his left hand, left forearm and sometimes in his left upper arm although not experiencing this currently.  Also describing some warmness sensation to his right calf.  States that he has been experiencing this for the past couple of days.  Only last about 10 seconds or so when it occurs; Sometimes at rest and sometimes while walking.  Denies any visual disturbance, gait abnormality, slurred speech, facial droop, weakness in upper or lower extremities.   No significant past medical history.  Home Medications Prior to Admission medications   Medication Sig Start Date End Date Taking? Authorizing Provider  cyclobenzaprine  (FLEXERIL ) 10 MG tablet Take 1 tablet (10 mg total) by mouth 2 (two) times daily as needed for muscle spasms. 12/09/23  Yes Neil Balls A, PA  predniSONE  (STERAPRED UNI-PAK 21 TAB) 10 MG (21) TBPK tablet Take by mouth daily. Take 6 tabs by mouth daily  for 2 days, then 5 tabs for 2 days, then 4 tabs for 2 days, then 3 tabs for 2 days, 2 tabs for 2 days, then 1 tab by mouth daily for 2 days 12/10/23  Yes Neil Balls A, PA  doxycycline  (VIBRAMYCIN ) 100 MG capsule Take 1 capsule (100 mg total) by mouth 2 (two) times daily. One po bid x 7 days 10/17/23   Palumbo, April, MD       Allergies    Patient has no known allergies.    Review of Systems   Review of Systems  All other systems reviewed and are negative.   Physical Exam Updated Vital Signs BP 129/84 (BP Location: Right Arm)   Pulse 80   Temp 98.4 F (36.9 C)   Resp 18   SpO2 97%  Physical Exam Vitals and nursing note reviewed.  Constitutional:      General: He is not in acute distress.    Appearance: He is well-developed.  HENT:     Head: Normocephalic and atraumatic.  Eyes:     Conjunctiva/sclera: Conjunctivae normal.  Cardiovascular:     Rate and Rhythm: Normal rate and regular rhythm.     Heart sounds: No murmur heard. Pulmonary:     Effort: Pulmonary effort is normal. No respiratory distress.     Breath sounds: Normal breath sounds.  Abdominal:     Palpations: Abdomen is soft.     Tenderness: There is no abdominal tenderness.  Musculoskeletal:        General: No swelling.     Cervical back: Neck supple.     Comments: For range of motion bilateral upper lower extremities.  No edema upper or lower extremities.  Radial, pedal and posterior tibial pulses 2+ bilaterally.  No midline tenderness cervical, thoracic, lumbar  spine without step-off or deformity.  No paraspinal tenderness noted bilaterally in cervical region.  Skin:    General: Skin is warm and dry.     Capillary Refill: Capillary refill takes less than 2 seconds.  Neurological:     Mental Status: He is alert.     Comments: Alert and oriented to self, place, time and event.   Speech is fluent, clear without dysarthria or dysphasia.   Strength 5/5 in upper/lower extremities   Patient reporting tingling type sensation in digits 2 through 4 with no significant in digit 4 of left hand.  Tingling sensation left forearm anteriorly.  Otherwise sensation intact in upper/lower extremities   Normal gait.  Negative Romberg. No pronator drift.  Normal finger-to-nose and feet tapping.  CN I not tested  CN II not tested CN III, Nguyen,  VI PERRLA and EOMs intact bilaterally  CN V Intact sensation to sharp and light touch to the face  CN VII facial movements symmetric  CN VIII not tested  CN IX, X no uvula deviation, symmetric rise of soft palate  CN XI 5/5 SCM and trapezius strength bilaterally  CN XII Midline tongue protrusion, symmetric L/R movements     Psychiatric:        Mood and Affect: Mood normal.     ED Results / Procedures / Treatments   Labs (all labs ordered are listed, but only abnormal results are displayed) Labs Reviewed  CBC - Abnormal; Notable for the following components:      Result Value   Hemoglobin 12.9 (*)    HCT 38.4 (*)    All other components within normal limits  BASIC METABOLIC PANEL WITH GFR  CBG MONITORING, ED    EKG None  Radiology CT Cervical Spine Wo Contrast Result Date: 12/09/2023 CLINICAL DATA:  Atraumatic numbness and tingling in left upper extremity. EXAM: CT CERVICAL SPINE WITHOUT CONTRAST TECHNIQUE: Multidetector CT imaging of the cervical spine was performed without intravenous contrast. Multiplanar CT image reconstructions were also generated. RADIATION DOSE REDUCTION: This exam was performed according to the departmental dose-optimization program which includes automated exposure control, adjustment of the mA and/or kV according to patient size and/or use of iterative reconstruction technique. COMPARISON:  None Available. FINDINGS: Alignment: There is straightening of the normal cervical spine lordosis. Skull base and vertebrae: No acute fracture. No primary bone lesion or focal pathologic process. Soft tissues and spinal canal: No prevertebral fluid or swelling. No visible canal hematoma. Disc levels: Normal multilevel endplates are seen throughout the cervical spine with normal multilevel intervertebral disc spaces. Normal, bilateral multilevel facet joints are noted. Upper chest: Negative. Other: Multiple bilateral subcentimeter posterior cervical chain lymph nodes are  seen. IMPRESSION: 1. No acute fracture or subluxation in the cervical spine. 2. Straightening of the normal cervical spine lordosis, which may be due to positioning or muscle spasm. Electronically Signed   By: Virgle Grime M.D.   On: 12/09/2023 12:36   CT Head Wo Contrast Result Date: 12/09/2023 CLINICAL DATA:  Atraumatic numbness and tingling in the left upper extremity. EXAM: CT HEAD WITHOUT CONTRAST TECHNIQUE: Contiguous axial images were obtained from the base of the skull through the vertex without intravenous contrast. RADIATION DOSE REDUCTION: This exam was performed according to the departmental dose-optimization program which includes automated exposure control, adjustment of the mA and/or kV according to patient size and/or use of iterative reconstruction technique. COMPARISON:  April 13, 2012 FINDINGS: Brain: No evidence of acute infarction, hemorrhage, hydrocephalus, extra-axial collection or mass  lesion/mass effect. Vascular: No hyperdense vessel or unexpected calcification. Skull: Normal. Negative for fracture or focal lesion. Sinuses/Orbits: No acute finding. Other: None. IMPRESSION: No acute intracranial pathology. Electronically Signed   By: Virgle Grime M.D.   On: 12/09/2023 12:34    Procedures Procedures    Medications Ordered in ED Medications  predniSONE  (DELTASONE ) tablet 60 mg (60 mg Oral Given 12/09/23 1326)    ED Course/ Medical Decision Making/ A&P                                 Medical Decision Making Amount and/or Complexity of Data Reviewed Labs: ordered. Radiology: ordered.  Risk Prescription drug management.   This patient presents to the ED for concern of tingling, this involves an extensive number of treatment options, and is a complaint that carries with it a high risk of complications and morbidity.  The differential diagnosis includes CVA, peripheral neuropathy, ischemic limb, intermittent claudication, vitamin deficiency, medication side  effect, nerve impingement, other   Co morbidities that complicate the patient evaluation  See HPI   Additional history obtained:  Additional history obtained from EMR External records from outside source obtained and reviewed including hospital records   Lab Tests:  I Ordered, and personally interpreted labs.  The pertinent results include: 88 no leukocytosis.  Anemia hemoglobin of 12.9.  Platelets within range.  No Electra abnormalities.  No renal dysfunction.   Imaging Studies ordered:  I ordered imaging studies including CT head/cervical spine I independently visualized and interpreted imaging which showed no acute abnormality.  Straightening of cervical spine. I agree with the radiologist interpretation   Cardiac Monitoring: / EKG:  The patient was maintained on a cardiac monitor.  I personally viewed and interpreted the cardiac monitored which showed an underlying rhythm of: En[place] rhythm   Consultations Obtained:  N/a   Problem List / ED Course / Critical interventions / Medication management  Tingling left arm I ordered medication including prednisone    Reevaluation of the patient after these medicines showed that the patient improved I have reviewed the patients home medicines and have made adjustments as needed   Social Determinants of Health:  Former cigarette.  Denies illicit drug use.   Test / Admission - Considered:  Tingling in left arm Vitals signs within normal range and stable throughout visit. Laboratory/imaging studies significant for: see above 33 year old male presents to the emergency department with complaints of left arm numbness.  States he has been having this for about the past week and a half or so.  Tends to worsen in certain positions he is sleeping and as well as when he initially wakes up.  States that his symptoms do not resolve whenever he moves his arm.  Over the past couple of days, has had more constant symptoms after  awakening.  Denies any real weakness in the arm.  Reports tingling sensation in his left hand, left forearm and sometimes in his left upper arm although not experiencing this currently.  Also describing some warmness sensation to his right calf.  States that he has been experiencing this for the past couple of days.  Only last about 10 seconds or so when it occurs; Sometimes at rest and sometimes while walking.  Denies any visual disturbance, gait abnormality, slurred speech, facial droop, weakness in upper or lower extremities.   On exam, nonfocal neurologic exam besides tingling sensation reported in left hand, forearm as above.  Patient symptoms tend to worsen when he was getting up from sleep or overnight with sleeping in certain positions.  CT imaging of patient's head and cervical spine obtained which did not show any obvious acute abnormality besides straightening of patient's cervical spine.  No overlying skin changes concerning for second infectious process.  No pulse deficits suggest ischemic limb.  No lower extremity edema concerning for DVT.  I suspect the patient's symptoms are likely coming from peripheral etiology cervical spine distally.  Will trial prednisone  taper and have him follow-up with primary care/neurosurgery for reassessment.  Regarding warmness in right calf, no obvious swelling or palpable cord/mass.  Pulses symmetric and full bilateral lower extremities; does not seem likely related to ischemic limb.  No obvious tenderness or appreciable described warm sensation upon exam; unknown etiology of the symptoms will recommend follow-up with PCP for reassessment.  Treatment plan discussed with patient and he acknowledged understanding was agreeable to said plan.  Patient overall well-appearing, afebrile in no acute distress. Worrisome signs and symptoms were discussed with the patient, and the patient acknowledged understanding to return to the ED if noticed. Patient was stable upon  discharge.          Final Clinical Impression(s) / ED Diagnoses Final diagnoses:  Numbness and tingling in left arm    Rx / DC Orders ED Discharge Orders          Ordered    predniSONE  (STERAPRED UNI-PAK 21 TAB) 10 MG (21) TBPK tablet  Daily        12/09/23 1257    cyclobenzaprine  (FLEXERIL ) 10 MG tablet  2 times daily PRN        12/09/23 1257              Groton Butter, [place] 12/09/23 1331    Zackowski, Scott, MD 12/10/23 [phone]

## 2023-12-09 NOTE — Discharge Instructions (Signed)
 As discussed, I suspect that your left arm tingling sensation is most likely coming from your cervical spine.  CT scan did not show obvious stroke in the brain with some cervical lordosis which could be causing the symptoms pressing on the nerve.  Will treat you with a steroid as well as muscle relaxer.  Recommend follow-up with primary care/neurosurgeon specialist in the outpatient setting for reassessment.  Please do not hesitate to return if the worrisome signs and symptoms we discussed become apparent.

## 2023-12-09 NOTE — ED Triage Notes (Signed)
 Numbness in left arm, tingling "dead arm" Comes and goes  Started last night are 9 pm  Some warmness feeling in right leg x 2 days

## 2023-12-11 ENCOUNTER — Encounter (HOSPITAL_BASED_OUTPATIENT_CLINIC_OR_DEPARTMENT_OTHER): Payer: Self-pay

## 2023-12-11 ENCOUNTER — Emergency Department (HOSPITAL_COMMUNITY)

## 2023-12-11 ENCOUNTER — Other Ambulatory Visit: Payer: Self-pay

## 2023-12-11 ENCOUNTER — Emergency Department (HOSPITAL_BASED_OUTPATIENT_CLINIC_OR_DEPARTMENT_OTHER)

## 2023-12-11 ENCOUNTER — Emergency Department (HOSPITAL_BASED_OUTPATIENT_CLINIC_OR_DEPARTMENT_OTHER)
Admission: EM | Admit: 2023-12-11 | Discharge: 2023-12-11 | Disposition: A | Discharge status: Home / Self Care | Attending: Emergency Medicine | Admitting: Emergency Medicine

## 2023-12-11 DIAGNOSIS — R202 Paresthesia of skin: Secondary | ICD-10-CM

## 2023-12-11 DIAGNOSIS — R253 Fasciculation: Secondary | ICD-10-CM | POA: Insufficient documentation

## 2023-12-11 DIAGNOSIS — G5132 Clonic hemifacial spasm, left: Secondary | ICD-10-CM

## 2023-12-11 DIAGNOSIS — G514 Facial myokymia: Secondary | ICD-10-CM

## 2023-12-11 LAB — CBC WITH DIFFERENTIAL/PLATELET
Abs Immature Granulocytes: 0.01 10*3/uL (ref 0.00–0.07)
Basophils Absolute: 0.1 10*3/uL (ref 0.0–0.1)
Basophils Relative: 1 %
Eosinophils Absolute: 0.1 10*3/uL (ref 0.0–0.5)
Eosinophils Relative: 1 %
HCT: 38.7 % — ABNORMAL LOW (ref 39.0–52.0)
Hemoglobin: 12.6 g/dL — ABNORMAL LOW (ref 13.0–17.0)
Immature Granulocytes: 0 %
Lymphocytes Relative: 27 %
Lymphs Abs: 1.8 10*3/uL (ref 0.7–4.0)
MCH: 29.4 pg (ref 26.0–34.0)
MCHC: 32.6 g/dL (ref 30.0–36.0)
MCV: 90.4 fL (ref 80.0–100.0)
Monocytes Absolute: 0.6 10*3/uL (ref 0.1–1.0)
Monocytes Relative: 9 %
Neutro Abs: 4.2 10*3/uL (ref 1.7–7.7)
Neutrophils Relative %: 62 %
Platelets: 315 10*3/uL (ref 150–400)
RBC: 4.28 MIL/uL (ref 4.22–5.81)
RDW: 12.7 % (ref 11.5–15.5)
WBC: 6.7 10*3/uL (ref 4.0–10.5)
nRBC: 0 % (ref 0.0–0.2)

## 2023-12-11 LAB — BASIC METABOLIC PANEL WITH GFR
Anion gap: 13 (ref 5–15)
BUN: 15 mg/dL (ref 6–20)
CO2: 25 mmol/L (ref 22–32)
Calcium: 10.1 mg/dL (ref 8.9–10.3)
Chloride: 100 mmol/L (ref 98–111)
Creatinine, Ser: 1.21 mg/dL (ref 0.61–1.24)
GFR, Estimated: >60 mL/min (ref >60)
Glucose, Bld: 94 mg/dL (ref 70–99)
Potassium: 3.9 mmol/L (ref 3.5–5.1)
Sodium: 138 mmol/L (ref 135–145)

## 2023-12-11 MED ORDER — GADOBUTROL 1 MMOL/ML IV SOLN
10.0000 mL | Freq: Once | INTRAVENOUS | Status: AC | PRN
  Administered 2023-12-11: 10 mL via INTRAVENOUS

## 2023-12-11 MED ORDER — IOHEXOL 350 MG/ML SOLN
75.0000 mL | Freq: Once | INTRAVENOUS | Status: AC | PRN
  Administered 2023-12-11: 75 mL via INTRAVENOUS

## 2023-12-11 NOTE — Progress Notes (Signed)
 Discussed with Dr. Nolia Baumgartner. 33 y.o. male w/ no significant known medical problems presented with left leg intermittent paraesthesias on 6/6, ongoing for 1.5 weeks, possibly worsening in certain positions and involving left hand/forearm and upper arm at times. Also warm sensation to the right calf for 10 seconds or so at a time.   Today progressed to left cheek spasms/twitching for 30 min. 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  He describes that the tingling waxes and wanes and has been worse over the past 2 weeks. Denies any new injury or trauma. Pt also reports "uneven pressure in my head" and  "a feeling of having to gasp for air" x 2 days. Episodes tend to last for a couple seconds and spontaneously resolves. "  Recommend  - MRI brain w/ and w/o contrast  - Would clarify nature of sensory symptoms. 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 If more non-specific patchy numbness, would not start anti-seizure medications if MRI negative and just have outpatient f/u   Basic Metabolic Panel: Recent Labs  Lab 12/09/23 1143 12/11/23 1620  NA 140 138  K 4.0 3.9  CL 104 100  CO2 22 25  GLUCOSE 88 94  BUN 13 15  CREATININE 1.15 1.21  CALCIUM 9.6 10.1    CBC: Recent Labs  Lab 12/09/23 1143 12/11/23 1620  WBC 5.6 6.7  NEUTROABS  --  4.2  HGB 12.9* 12.6*  HCT 38.4* 38.7*  MCV 89.5 90.4  PLT  347 315    Coagulation Studies: No results for input(s): "LABPROT", "INR" in the last 72 hours.   12 min spent in care of patient, majority in discussion with Dr. Nolia Baumgartner

## 2023-12-11 NOTE — ED Triage Notes (Signed)
 Left cheek spasms/twitching and numbness onset 30 minutes. Intermittent.

## 2023-12-11 NOTE — ED Notes (Signed)
 AVS provided by edp was reviewed with pt. Pt verbalized understanding with no additional questions at this time. Pt called cab for ride back to [facility].

## 2023-12-11 NOTE — ED Notes (Signed)
 Patient transported to MRI

## 2023-12-11 NOTE — Discharge Instructions (Signed)
 You were seen in the [facility] ED and in the [facility] ED for facial twitching and spasms The MRI looks okay Based on your history we will not send a new prescription for any medications but is important that you follow-up with neurology Please call the number listed for neurology to make an appointment to be seen in the next 1 to 2 weeks to discuss your symptoms Return to the emergency department for weakness in one-sided body, severe headaches or any other concerns

## 2023-12-11 NOTE — ED Provider Notes (Signed)
 [place] EMERGENCY DEPARTMENT AT [facility] Provider Note   CSN: [id number] Arrival date & time: 12/11/23  1353     History  Chief Complaint  Patient presents with   Facial Twitching/Spasm    Gabriel Nguyen is a 33 y.o. male.  Patient is a 33 year old with a history of anxiety who presents with left-sided facial twitching.  He said it started about 2:00 this afternoon.  He noticed some numbness to the left side of his face and some twitching of his cheek and then his upper eye brow.  It seems to is resolved now although he says it still feels numb.  He denies any facial drooping.  No speech deficits.  No vision changes.  He has had some numbness in his left arm intermittently for the last 3 to 4 days.  He says sometimes it feels a little weaker when the numbness is there but overall does not report any weakness in the left arm.  No leg involvement.  He sometimes will have some neck pain but no current neck pain.  He was seen in the ED 2 days ago for the left arm numbness.  He had CT scan of the head and cervical spine which did not show any acute abnormality.       Home Medications Prior to Admission medications   Medication Sig Start Date End Date Taking? Authorizing Provider  predniSONE  (STERAPRED UNI-PAK 21 TAB) 10 MG (21) TBPK tablet Take by mouth daily. Take 6 tabs by mouth daily  for 2 days, then 5 tabs for 2 days, then 4 tabs for 2 days, then 3 tabs for 2 days, 2 tabs for 2 days, then 1 tab by mouth daily for 2 days 12/10/23  Yes Neil Balls A, PA  cyclobenzaprine  (FLEXERIL ) 10 MG tablet Take 1 tablet (10 mg total) by mouth 2 (two) times daily as needed for muscle spasms. 12/09/23   Dante Butter, PA  doxycycline  (VIBRAMYCIN ) 100 MG capsule Take 1 capsule (100 mg total) by mouth 2 (two) times daily. One po bid x 7 days 10/17/23   Palumbo, April, MD      Allergies    Patient has no known allergies.    Review of Systems   Review of Systems  Constitutional:   Negative for chills, diaphoresis, fatigue and fever.  HENT:  Negative for congestion, rhinorrhea and sneezing.   Eyes: Negative.   Respiratory:  Negative for cough, chest tightness and shortness of breath.   Cardiovascular:  Negative for chest pain and leg swelling.  Gastrointestinal:  Negative for abdominal pain, blood in stool, diarrhea, nausea and vomiting.  Genitourinary:  Negative for difficulty urinating, flank pain, frequency and hematuria.  Musculoskeletal:  Negative for arthralgias and back pain.  Skin:  Negative for rash.  Neurological:  Positive for numbness. Negative for dizziness, speech difficulty, weakness and headaches.       Facial twitching    Physical Exam Updated Vital Signs BP (!) 131/91   Pulse 74   Temp 98.9 F (37.2 C) (Oral)   Resp 17   Ht 6\' 6"  (1.981 m)   Wt 96.6 kg   SpO2 100%   BMI 24.61 kg/m  Physical Exam Constitutional:      Appearance: He is well-developed.  HENT:     Head: Normocephalic and atraumatic.  Eyes:     Pupils: Pupils are equal, round, and reactive to light.  Cardiovascular:     Rate and Rhythm: Normal rate and regular  rhythm.     Heart sounds: Normal heart sounds.  Pulmonary:     Effort: Pulmonary effort is normal. No respiratory distress.     Breath sounds: Normal breath sounds. No wheezing or rales.  Chest:     Chest wall: No tenderness.  Abdominal:     General: Bowel sounds are normal.     Palpations: Abdomen is soft.     Tenderness: There is no abdominal tenderness. There is no guarding or rebound.  Musculoskeletal:        General: Normal range of motion.     Cervical back: Normal range of motion and neck supple.  Lymphadenopathy:     Cervical: No cervical adenopathy.  Skin:    General: Skin is warm and dry.     Findings: No rash.  Neurological:     Mental Status: He is alert and oriented to person, place, and time.     Comments: Motor 5/5 all extremities Sensation grossly intact to LT all extremities Finger to  Nose intact, no pronator drift CN II-XII grossly intact other than some decrease sensation to light touch in the left face, all nerve distributions, no obvious facial drooping       ED Results / Procedures / Treatments   Labs (all labs ordered are listed, but only abnormal results are displayed) Labs Reviewed  CBC WITH DIFFERENTIAL/PLATELET - Abnormal; Notable for the following components:      Result Value   Hemoglobin 12.6 (*)    HCT 38.7 (*)    All other components within normal limits  BASIC METABOLIC PANEL WITH GFR    EKG None  Radiology CT Angio Head Neck W WO CM Result Date: 12/11/2023 EXAM: CT HEAD WITHOUT CTA HEAD AND NECK WITH AND WITHOUT 12/11/2023 05:18:47 PM TECHNIQUE: CTA of the head and neck was performed with and without the administration of intravenous contrast. Noncontrast CT of the head with reconstructed 2-D images are also provided for review. Multiplanar 2D and/or 3D reformatted images are provided for review. Automated exposure control, iterative reconstruction, and/or weight based adjustment of the mA/kV was utilized to reduce the radiation dose to as low as reasonably achievable. COMPARISON: CT head without contrast 12/09/2023. CLINICAL HISTORY: Neuro deficit, acute, stroke suspected. Left sided facial spasms onset today. Was seen a few days ago about weakness and tingling in left arm. FINDINGS: CT HEAD: BRAIN AND VENTRICLES: No acute intracranial hemorrhage. No mass effect or midline shift. No extra-axial fluid collection. Gray-white differentiation is maintained. No hydrocephalus. ORBITS: No acute abnormality. SINUSES: No acute abnormality. SOFT TISSUES AND SKULL: No acute abnormality. CTA NECK: AORTIC ARCH AND ARCH VESSELS: No dissection or arterial injury. No significant stenosis of the brachiocephalic or subclavian arteries. CERVICAL CAROTID ARTERIES: No dissection, arterial injury, or hemodynamically significant stenosis by NASCET criteria. CERVICAL VERTEBRAL  ARTERIES: No dissection, arterial injury, or significant stenosis. VISUALIZED LUNGS AND MEDIASTINUM: Unremarkable. SOFT TISSUES: No acute abnormality. BONES: No acute abnormality. CTA HEAD: ANTERIOR CIRCULATION: No significant stenosis of the internal carotid arteries. No significant stenosis of the anterior cerebral arteries. No significant stenosis of the middle cerebral arteries. No aneurysm. POSTERIOR CIRCULATION: A fetal type right posterior cerebral artery is present. No significant stenosis of the basilar artery. No significant stenosis of the vertebral arteries. No aneurysm. OTHER: No dural venous sinus thrombosis on this non-dedicated study. IMPRESSION: 1. No acute intracranial hemorrhage. 2. No large vessel occlusion, hemodynamically significant stenosis, or aneurysm in the head or neck. Electronically signed by: Audree Leas MD 12/11/2023 05:28  PM EDT RP Workstation: [id number]    Procedures Procedures    Medications Ordered in ED Medications  iohexol (OMNIPAQUE) 350 MG/ML injection 75 mL (75 mLs Intravenous Contrast Given 12/11/23 1708)    ED Course/ Medical Decision Making/ A&P                                 Medical Decision Making Amount and/or Complexity of Data Reviewed Labs: ordered. Radiology: ordered.  Risk Prescription drug management.   This patient presents to the ED for concern of facial numbness, twitching, this involves an extensive number of treatment options, and is a complaint that carries with it a high risk of complications and morbidity.  I considered the following differential and admission for this acute, potentially life threatening condition.  The differential diagnosis includes seizure, stroke, brain mass, intracranial hemorrhage, facial nerve injury, tremor  MDM:    Patient had a recent CT scan of his head and cervical spine which I reviewed.  CTA was performed today which does not show any vascular injury or occlusion.  Labs are nonconcerning.   Discussed with Dr. Cleone Dad with neurology.  She recommends getting an MRI with and without contrast given that it sounds like he could be having some focal seizures.  Will transport to [facility] to get MRI.  He does report that he has had some facial numbness in the past that was related to prior mandible injury/surgery.  However this feels new and involve the whole face with the facial spasms.  Dr. Cleone Dad did advise that if it sound like he was having progressive symptoms or rising area of numbness or twitching from the hand to the face, may consider starting him on Keppra and follow-up with neurology as an outpatient, assuming that the MRI is negative.  Discussed with Dr. Carylon Claude who is excepted the patient for transfer to [facility].  (Labs, imaging, consults)  Labs: I Ordered, and personally interpreted labs.  The pertinent results include: Nonconcerning, CBC, electrolytes  Imaging Studies ordered: I ordered imaging studies including CTA of the head and neck I independently visualized and interpreted imaging. I agree with the radiologist interpretation  Additional history obtained from chart review.  External records from outside source obtained and reviewed including prior notes  Cardiac Monitoring: The patient was maintained on a cardiac monitor.  If on the cardiac monitor, I personally viewed and interpreted the cardiac monitored which showed an underlying rhythm of: Sinus rhythm  Reevaluation: After the interventions noted above, I reevaluated the patient and found that they have :improved  Social Determinants of Health:  none  Disposition: Transfer to [facility]  Co morbidities that complicate the patient evaluation  Past Medical History:  Diagnosis Date   Anxiety    pt. 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     Medicines Meds ordered this encounter  Medications   iohexol (OMNIPAQUE) 350 MG/ML  injection 75 mL    I have reviewed the patients home medicines and have made adjustments as needed  Problem List / ED Course: Problem List Items Addressed This Visit   None Visit Diagnoses       Facial twitching    -  Primary             Final Clinical Impression(s) / ED Diagnoses Final diagnoses:  Facial twitching    Rx / DC Orders ED  Discharge Orders     None         Hershel Los, MD 12/11/23 1930

## 2023-12-11 NOTE — ED Notes (Signed)
 Clydie Braun with cl called for transport

## 2023-12-11 NOTE — ED Provider Notes (Signed)
  Physical Exam  BP 129/80 (BP Location: Right Arm)   Pulse 65   Temp 98.1 F (36.7 C) (Oral)   Resp 16   Ht 6\' 6"  (1.981 m)   Wt 96.6 kg   SpO2 100%   BMI 24.61 kg/m   Physical Exam Vitals and nursing note reviewed.  HENT:     Head: Normocephalic and atraumatic.  Eyes:     Pupils: Pupils are equal, round, and reactive to light.  Cardiovascular:     Rate and Rhythm: Normal rate and regular rhythm.  Pulmonary:     Effort: Pulmonary effort is normal.     Breath sounds: Normal breath sounds.  Abdominal:     Palpations: Abdomen is soft.     Tenderness: There is no abdominal tenderness.  Skin:    General: Skin is warm and dry.  Neurological:     Mental Status: He is alert.  Psychiatric:        Mood and Affect: Mood normal.     Procedures  Procedures  ED Course / MDM   Clinical Course as of 12/11/23 2316  Sun Dec 11, 2023  2314 MRI brain shows no acute intracranial abnormalities.  I reviewed Dr. Nicholes Barks consult note.  Based on the patient's story his symptoms seem to arise throughout different locations over the left side of his face.  He is actually able to reproduce with the muscle twitching looks like by shrugging his shoulders up and down quickly.  Will hold off on starting Keppra at this time instruct for outpatient neurology follow-up.  Patient is in agreement with this plan and appropriate for discharge at this time [MP]    Clinical Course User Index [MP] Sallyanne Creamer, DO   Medical Decision Making Patient transferred from [facility] for further evaluation and MRI given concern for facial spasms and potential focal seizures.  Dr. Nolia Baumgartner evaluated the [facility] at [facility] and discussed with Dr.Bhagat (neurology).  Will obtain MRI here and discussed with neuro once MRI study has been completed  Amount and/or Complexity of Data Reviewed Labs: ordered. Radiology: ordered.  Risk Prescription drug management.          Sallyanne Creamer,  DO 12/11/23 2316

## 2023-12-30 ENCOUNTER — Encounter: Payer: Self-pay | Admitting: Neurology

## 2024-01-21 ENCOUNTER — Emergency Department (HOSPITAL_BASED_OUTPATIENT_CLINIC_OR_DEPARTMENT_OTHER)
Admission: EM | Admit: 2024-01-21 | Discharge: 2024-01-21 | Disposition: A | Discharge status: Home / Self Care | Attending: Emergency Medicine | Admitting: Emergency Medicine

## 2024-01-21 ENCOUNTER — Emergency Department (HOSPITAL_BASED_OUTPATIENT_CLINIC_OR_DEPARTMENT_OTHER): Admitting: Radiology

## 2024-01-21 ENCOUNTER — Other Ambulatory Visit: Payer: Self-pay

## 2024-01-21 DIAGNOSIS — W208XXA Other cause of strike by thrown, projected or falling object, initial encounter: Secondary | ICD-10-CM | POA: Diagnosis not present

## 2024-01-21 DIAGNOSIS — S62663A Nondisplaced fracture of distal phalanx of left middle finger, initial encounter for closed fracture: Secondary | ICD-10-CM | POA: Diagnosis not present

## 2024-01-21 DIAGNOSIS — S6992XA Unspecified injury of left wrist, hand and finger(s), initial encounter: Secondary | ICD-10-CM | POA: Diagnosis present

## 2024-01-21 NOTE — Discharge Instructions (Signed)
 You were seen in the emergency department for evaluation of left middle finger injury.  Your x-ray showed a fracture of the distal phalanx of the finger.  This should heal on its own.  You can use ice to the area to limit the swelling.  Soap and water.  Watch for signs of infection.  Finger splint for comfort.  Follow-up with hand surgeon if any concerns.

## 2024-01-21 NOTE — ED Triage Notes (Signed)
 Pt sts he dropped a 10 lb weight on his left middle finger at the gym yesterday. Noted swelling and punctured it with a needle at home.

## 2024-01-21 NOTE — ED Provider Notes (Signed)
 [place] EMERGENCY DEPARTMENT AT [facility] Provider Note   CSN: [id number] Arrival date & time: 01/21/24  0544     Patient presents with: Finger Injury (L middle )   Gabriel Nguyen is a 33 y.o. male.  He had a crush injury to his left middle finger distal when he dropped a weight on it yesterday.  He said there was a little blister on the palmar aspect that he popped with a needle.  It continues to be swollen although not much pain.  No other injuries or complaints.   The history is provided by the patient.  Hand Pain This is a new problem. The current episode started yesterday. The problem occurs constantly. The problem has not changed since onset.The symptoms are aggravated by bending. Nothing relieves the symptoms. He has tried rest for the symptoms. The treatment provided mild relief.       Prior to Admission medications   Medication Sig Start Date End Date Taking? Authorizing Provider  cyclobenzaprine  (FLEXERIL ) 10 MG tablet Take 1 tablet (10 mg total) by mouth 2 (two) times daily as needed for muscle spasms. 12/09/23   Silver Wonda LABOR, PA  doxycycline  (VIBRAMYCIN ) 100 MG capsule Take 1 capsule (100 mg total) by mouth 2 (two) times daily. One po bid x 7 days 10/17/23   Palumbo, April, MD  predniSONE  (STERAPRED UNI-PAK 21 TAB) 10 MG (21) TBPK tablet Take by mouth daily. Take 6 tabs by mouth daily  for 2 days, then 5 tabs for 2 days, then 4 tabs for 2 days, then 3 tabs for 2 days, 2 tabs for 2 days, then 1 tab by mouth daily for 2 days 12/10/23   Silver Wonda LABOR, PA    Allergies: Patient has no known allergies.    Review of Systems  Updated Vital Signs BP (!) 135/96 (BP Location: Right Arm)   Pulse 83   Temp 98.1 F (36.7 C) (Oral)   Resp 15   SpO2 95%   Physical Exam Vitals and nursing note reviewed.  Constitutional:      Appearance: Normal appearance. He is well-developed.  HENT:     Head: Normocephalic and atraumatic.  Eyes:     Conjunctiva/sclera:  Conjunctivae normal.  Pulmonary:     Effort: Pulmonary effort is normal.  Musculoskeletal:        General: Swelling and tenderness present.     Cervical back: Neck supple.     Comments: Injury to left middle finger distal phalanx.  There is some swelling.  The nail is intact and there is no subungual hematoma.  He has a small skin defect on the palmar aspect just distal to the DIP.  There is no drainage.  Skin:    General: Skin is warm and dry.  Neurological:     Mental Status: He is alert.     GCS: GCS eye subscore is 4. GCS verbal subscore is 5. GCS motor subscore is 6.     Sensory: No sensory deficit.     Motor: No weakness.     (all labs ordered are listed, but only abnormal results are displayed) Labs Reviewed - No data to display  EKG: None  Radiology: DG Finger Middle Left Result Date: 01/21/2024 CLINICAL DATA:  Injury.  Dropped weight on third digit yesterday. EXAM: LEFT MIDDLE FINGER 2+V COMPARISON:  None Available. FINDINGS: There is a nondisplaced fracture involving the radial side of the proximal third distal metaphysis without signs of intra-articular extension. A second  fracture is noted involving the radial side of the distal aspect of the third distal phalanx, also nondisplaced. No signs of dislocation. No other fractures. IMPRESSION: Nondisplaced fractures involving the radial side of the proximal third distal metaphysis and distal aspect of the third distal phalanx. Electronically Signed   By: Waddell Calk M.D.   On: 01/21/2024 06:47     Procedures   Medications Ordered in the ED - No data to display                                  Medical Decision Making Amount and/or Complexity of Data Reviewed Radiology: ordered.   This patient complains of left middle finger injury; this involves an extensive number of treatment Options and is a complaint that carries with it a high risk of complications and morbidity. The differential includes fracture, contusion,  dislocation, tendon injury I ordered imaging studies which included x-ray finger and I independently    visualized and interpreted imaging which showed distal phalanx fracture Previous records obtained and reviewed in epic no recent visits  Social determinants considered, no significant barriers Critical Interventions: None  After the interventions stated above, I reevaluated the patient and found patient to be in no distress Admission and further testing considered, no indications for admission or further workup at this time.  Will treat with finger splint.  Given contact information for outpatient hand surgery if any problems.  Return instructions discussed      Final diagnoses:  Closed nondisplaced fracture of distal phalanx of left middle finger, initial encounter    ED Discharge Orders     None          Towana Ozell BROCKS, MD 01/21/24 [phone]

## 2024-02-22 ENCOUNTER — Encounter: Payer: Self-pay | Admitting: Neurology

## 2024-02-22 ENCOUNTER — Ambulatory Visit: Admitting: Neurology
